# Patient Record
Sex: Female | Born: 1955
Health system: Southern US, Community
[De-identification: ages and names within clinical notes are randomized; demographics above are authoritative.]

## PROBLEM LIST (undated history)

## (undated) DIAGNOSIS — G44009 Cluster headache syndrome, unspecified, not intractable: Secondary | ICD-10-CM

## (undated) DIAGNOSIS — G8929 Other chronic pain: Secondary | ICD-10-CM

## (undated) DIAGNOSIS — R519 Headache, unspecified: Secondary | ICD-10-CM

## (undated) DIAGNOSIS — R51 Headache: Secondary | ICD-10-CM

## (undated) HISTORY — PX: TONSILLECTOMY: SUR1361

## (undated) HISTORY — PX: NOSE SURGERY: SHX723

## (undated) HISTORY — DX: Headache, unspecified: R51.9

## (undated) HISTORY — PX: APPENDECTOMY: SHX54

## (undated) HISTORY — DX: Headache: R51

## (undated) HISTORY — DX: Other chronic pain: G89.29

## (undated) HISTORY — PX: ABDOMINAL HYSTERECTOMY: SHX81

## (undated) HISTORY — PX: TOTAL ABDOMINAL HYSTERECTOMY: SHX209

---

## 2006-06-03 ENCOUNTER — Emergency Department: Payer: Self-pay | Admitting: Internal Medicine

## 2006-06-13 ENCOUNTER — Emergency Department: Payer: Self-pay | Admitting: Emergency Medicine

## 2006-11-10 ENCOUNTER — Ambulatory Visit: Payer: Self-pay | Admitting: Internal Medicine

## 2006-11-15 ENCOUNTER — Ambulatory Visit: Payer: Self-pay | Admitting: Internal Medicine

## 2006-12-12 ENCOUNTER — Emergency Department: Payer: Self-pay | Admitting: Emergency Medicine

## 2009-09-12 ENCOUNTER — Emergency Department: Payer: Self-pay | Admitting: Emergency Medicine

## 2010-07-08 ENCOUNTER — Ambulatory Visit: Payer: Self-pay | Admitting: Family Medicine

## 2013-02-09 ENCOUNTER — Emergency Department (HOSPITAL_COMMUNITY)
Admission: EM | Admit: 2013-02-09 | Discharge: 2013-02-10 | Disposition: A | Discharge status: Home / Self Care | Payer: No Typology Code available for payment source | Attending: Emergency Medicine | Admitting: Emergency Medicine

## 2013-02-09 ENCOUNTER — Encounter (HOSPITAL_COMMUNITY): Payer: Self-pay | Admitting: Emergency Medicine

## 2013-02-09 DIAGNOSIS — S42002A Fracture of unspecified part of left clavicle, initial encounter for closed fracture: Secondary | ICD-10-CM

## 2013-02-09 DIAGNOSIS — S42102A Fracture of unspecified part of scapula, left shoulder, initial encounter for closed fracture: Secondary | ICD-10-CM

## 2013-02-09 DIAGNOSIS — Y9389 Activity, other specified: Secondary | ICD-10-CM | POA: Insufficient documentation

## 2013-02-09 DIAGNOSIS — IMO0002 Reserved for concepts with insufficient information to code with codable children: Secondary | ICD-10-CM | POA: Insufficient documentation

## 2013-02-09 DIAGNOSIS — S41009A Unspecified open wound of unspecified shoulder, initial encounter: Secondary | ICD-10-CM | POA: Insufficient documentation

## 2013-02-09 DIAGNOSIS — S0990XA Unspecified injury of head, initial encounter: Secondary | ICD-10-CM | POA: Insufficient documentation

## 2013-02-09 DIAGNOSIS — Z8669 Personal history of other diseases of the nervous system and sense organs: Secondary | ICD-10-CM | POA: Insufficient documentation

## 2013-02-09 DIAGNOSIS — S2239XA Fracture of one rib, unspecified side, initial encounter for closed fracture: Secondary | ICD-10-CM | POA: Insufficient documentation

## 2013-02-09 DIAGNOSIS — Y92410 Unspecified street and highway as the place of occurrence of the external cause: Secondary | ICD-10-CM | POA: Insufficient documentation

## 2013-02-09 DIAGNOSIS — S2232XA Fracture of one rib, left side, initial encounter for closed fracture: Secondary | ICD-10-CM

## 2013-02-09 DIAGNOSIS — F17200 Nicotine dependence, unspecified, uncomplicated: Secondary | ICD-10-CM | POA: Insufficient documentation

## 2013-02-09 DIAGNOSIS — S298XXA Other specified injuries of thorax, initial encounter: Secondary | ICD-10-CM | POA: Insufficient documentation

## 2013-02-09 DIAGNOSIS — S42199A Fracture of other part of scapula, unspecified shoulder, initial encounter for closed fracture: Secondary | ICD-10-CM | POA: Insufficient documentation

## 2013-02-09 DIAGNOSIS — S42033A Displaced fracture of lateral end of unspecified clavicle, initial encounter for closed fracture: Secondary | ICD-10-CM | POA: Insufficient documentation

## 2013-02-09 HISTORY — DX: Cluster headache syndrome, unspecified, not intractable: G44.009
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
## 2013-02-10 ENCOUNTER — Emergency Department (HOSPITAL_COMMUNITY): Payer: No Typology Code available for payment source

## 2013-02-10 MED ORDER — ONDANSETRON HCL 4 MG/2ML IJ SOLN
4.0000 mg | Freq: Once | INTRAMUSCULAR | Status: AC
  Administered 2013-02-10: 4 mg via INTRAVENOUS
  Filled 2013-02-10: qty 2

## 2013-02-10 MED ORDER — IOHEXOL 300 MG/ML  SOLN
100.0000 mL | Freq: Once | INTRAMUSCULAR | Status: AC | PRN
  Administered 2013-02-10: 100 mL via INTRAVENOUS

## 2013-02-10 MED ORDER — MORPHINE SULFATE 4 MG/ML IJ SOLN
4.0000 mg | Freq: Once | INTRAMUSCULAR | Status: AC
  Administered 2013-02-10: 4 mg via INTRAVENOUS
  Filled 2013-02-10: qty 1

## 2013-02-10 MED ORDER — HYDROCODONE-ACETAMINOPHEN 5-325 MG PO TABS: 2.0000 | ORAL_TABLET | ORAL | Status: DC | PRN

## 2013-02-10 MED ORDER — KETOROLAC TROMETHAMINE 60 MG/2ML IM SOLN
60.0000 mg | Freq: Once | INTRAMUSCULAR | Status: AC
  Administered 2013-02-10: 60 mg via INTRAMUSCULAR
  Filled 2013-02-10: qty 2

## 2013-02-10 MED ORDER — MORPHINE SULFATE 2 MG/ML IJ SOLN
2.0000 mg | Freq: Once | INTRAMUSCULAR | Status: AC
  Administered 2013-02-10: 2 mg via INTRAVENOUS
  Filled 2013-02-10: qty 1
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
## 2013-02-10 NOTE — ED Provider Notes (Signed)
Medical screening examination/treatment/procedure(s) were conducted as a shared visit with non-physician practitioner(s) and myself.  I personally evaluated the patient during the encounter  Please see my separate respective documentation pertaining to this patient encounter   Vida Roller, MD 02/10/13 [phone]
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
## 2014-11-07 ENCOUNTER — Telehealth: Payer: Self-pay | Admitting: Unknown Physician Specialty

## 2014-11-07 NOTE — Telephone Encounter (Signed)
Called pt to reschedule 11/22/14 appt, no answer, left voicemail, mailed a letter to the pt informing pt the appt has been cancelled. Thanks.

## 2014-11-22 ENCOUNTER — Encounter: Payer: Self-pay | Admitting: Unknown Physician Specialty

## 2014-12-16 ENCOUNTER — Encounter: Payer: Self-pay | Admitting: Unknown Physician Specialty
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
## 2015-08-13 ENCOUNTER — Encounter: Payer: Self-pay | Admitting: Family Medicine

## 2015-08-14 ENCOUNTER — Ambulatory Visit (INDEPENDENT_AMBULATORY_CARE_PROVIDER_SITE_OTHER): Payer: Self-pay | Admitting: Family Medicine

## 2015-08-14 ENCOUNTER — Encounter: Payer: Self-pay | Admitting: Family Medicine

## 2015-08-14 VITALS — BP 112/74 | HR 69 | Temp 98.6°F | Ht 69.0 in | Wt 200.0 lb

## 2015-08-14 DIAGNOSIS — G44009 Cluster headache syndrome, unspecified, not intractable: Secondary | ICD-10-CM | POA: Insufficient documentation

## 2015-08-14 DIAGNOSIS — G44029 Chronic cluster headache, not intractable: Secondary | ICD-10-CM

## 2015-08-14 DIAGNOSIS — H8112 Benign paroxysmal vertigo, left ear: Secondary | ICD-10-CM

## 2015-08-14 MED ORDER — VERAPAMIL HCL 80 MG PO TABS: 80.0000 mg | ORAL_TABLET | Freq: Three times a day (TID) | ORAL | Status: DC

## 2015-08-14 NOTE — Assessment & Plan Note (Signed)
Under good control. Continue verapamil. Recheck 6 months. Call with any concerns.

## 2015-08-14 NOTE — Progress Notes (Signed)
BP 112/74 mmHg  Pulse 69  Temp(Src) 98.6 F (37 C)  Ht  (1.753 m)  Wt 200 lb (90.719 kg)  BMI 29.52 kg/m2  SpO2 98%   Subjective:    Patient ID: Rhonda Blankenship, female    DOB: 1956/02/02, 60 y.o.   MRN: [id number]  HPI: Rhonda Blankenship is a 60 y.o. female  Chief Complaint  Patient presents with  . Establish Care   Cluster Headaches Duration: Since Sep 28, 1996, hasn't had one since last year when she had to go to the ER Onset: sudden Severity: severe Quality: sharp Frequency: intermittent Location: in the bone of the L side of her face Headache duration: hours to days Radiation: yes into her ear Time of day headache occurs: depends  Alleviating factors: ibuprofen, coffee, oxygen Aggravating factors: heat and changes in sleeping pattern Headache status at time of visit: asymptomatic Treatments attempted: Treatments attempted: verapamil    Aura: no Nausea:  no Vomiting: no Photophobia:  yes Phonophobia:  yes Effect on social functioning:  yes Confusion:  no Gait disturbance/ataxia:  no Behavioral changes:  no Fevers:  no  DIZZINESS Duration: last week for about 2 hours Description of symptoms: room spinning Duration of episode: about 2 hours Dizziness frequency: no history of the same Provoking factors: none Aggravating factors:  none Triggered by rolling over in bed: no Triggered by bending over: no Aggravated by head movement: no Aggravated by exertion, coughing, loud noises: no Recent head injury: no Recent or current viral symptoms: yes History of vasovagal episodes: no Nausea: no Vomiting: no Tinnitus: no Hearing loss: no Aural fullness: no Headache: no Photophobia/phonophobia: no Unsteady gait: no Postural instability: no Diplopia, dysarthria, dysphagia or weakness: no Related to exertion: no Pallor: no Diaphoresis: no Dyspnea: no Chest pain: no   Active Ambulatory Problems    Diagnosis Date Noted  . Cluster headaches    Resolved  Ambulatory Problems    Diagnosis Date Noted  . No Resolved Ambulatory Problems   Past Medical History  Diagnosis Date  . Chronic headache    Past Surgical History  Procedure Laterality Date  . Appendectomy    . Abdominal hysterectomy    . Tonsillectomy    . Nose surgery     No Known Allergies  Family History  Problem Relation Age of Onset  . Heart disease Mother   . Arthritis Sister   . Heart disease Brother   . Migraines Son   . Cancer Sister     Tumor  . Muscular dystrophy Brother   . Alzheimer's disease Maternal Grandmother   . Hypertension Maternal Grandmother   . Stroke Maternal Grandfather   . Heart attack Maternal Grandfather   . Hypertension Paternal Grandfather    Social History   Social History  . Marital Status: Single    Spouse Name: N/A  . Number of Children: N/A  . Years of Education: N/A   Social History Main Topics  . Smoking status: Current Every Day Smoker -- 0.50 packs/day    Types: Cigarettes  . Smokeless tobacco: Never Used  . Alcohol Use: 0.0 oz/week    0 Standard drinks or equivalent per week     Comment: Wine on occasion  . Drug Use: No  . Sexual Activity: Not Asked   Other Topics Concern  . None   Social History Narrative   Review of Systems  Constitutional: Negative.   HENT: Negative for congestion, dental problem, drooling, ear discharge, ear pain, facial swelling, hearing loss,  mouth sores, nosebleeds, postnasal drip, rhinorrhea, sinus pressure, sneezing, sore throat, tinnitus, trouble swallowing and voice change.   Respiratory: Negative.   Cardiovascular: Negative.   Musculoskeletal: Positive for myalgias. Negative for back pain, joint swelling, arthralgias, gait problem, neck pain and neck stiffness.  Skin: Negative.   Neurological: Positive for dizziness (last week for about an hour or so, visual changes) and headaches. Negative for tremors, seizures, syncope, facial asymmetry, speech difficulty, weakness, light-headedness  and numbness.    Per HPI unless specifically indicated above     Objective:    BP 112/74 mmHg  Pulse 69  Temp(Src) 98.6 F (37 C)  Ht 5\' 9"  (1.753 m)  Wt 200 lb (90.719 kg)  BMI 29.52 kg/m2  SpO2 98%  Wt Readings from Last 3 Encounters:  08/14/15 200 lb (90.719 kg)    Physical Exam  Constitutional: She is oriented to person, place, and time. She appears well-developed and well-nourished. No distress.  HENT:  Head: Normocephalic and atraumatic.  Right Ear: Hearing and external ear normal.  Left Ear: Hearing and external ear normal.  Nose: Nose normal.  Mouth/Throat: Oropharynx is clear and moist. No oropharyngeal exudate.  Eyes: Conjunctivae and lids are normal. Pupils are equal, round, and reactive to light. Right eye exhibits no discharge. Left eye exhibits no discharge. No scleral icterus. Right eye exhibits normal extraocular motion and no nystagmus. Left eye exhibits nystagmus. Left eye exhibits normal extraocular motion.  Neck: Normal range of motion. Neck supple. No JVD present. No tracheal deviation present. No thyromegaly present.  Cardiovascular: Normal rate, regular rhythm, normal heart sounds and intact distal pulses.  Exam reveals no gallop and no friction rub.   No murmur heard. Pulmonary/Chest: Effort normal and breath sounds normal. No stridor. No respiratory distress. She has no wheezes. She has no rales. She exhibits no tenderness.  Musculoskeletal: Normal range of motion.  Lymphadenopathy:    She has no cervical adenopathy.  Neurological: She is alert and oriented to person, place, and time. She has normal reflexes. She displays normal reflexes. No cranial nerve deficit. She exhibits normal muscle tone. Coordination normal.  Skin: Skin is warm, dry and intact. No rash noted. She is not diaphoretic. No erythema.  Psychiatric: She has a normal mood and affect. Her speech is normal and behavior is normal. Judgment and thought content normal. Cognition and memory  are normal.  Nursing note and vitals reviewed.   No results found for this or any previous visit.    Assessment & Plan:   Problem List Items Addressed This Visit      Nervous and Auditory   Cluster headaches - Primary    Under good control. Continue verapamil. Recheck 6 months. Call with any concerns.       Relevant Medications   verapamil (CALAN) 80 MG tablet    Other Visit Diagnoses    BPPV (benign paroxysmal positional vertigo), left        Epley's manuver given today. Monitor closely. Call with any concerns. Call if not getting better or getting worse.         Follow up plan: Return in about 6 months (around 02/14/2016) for Physical.

## 2016-02-17 ENCOUNTER — Encounter: Payer: Self-pay | Admitting: Family Medicine

## 2016-02-17 ENCOUNTER — Ambulatory Visit (INDEPENDENT_AMBULATORY_CARE_PROVIDER_SITE_OTHER): Payer: Self-pay | Admitting: Family Medicine

## 2016-02-17 VITALS — BP 117/80 | HR 77 | Temp 98.4°F | Ht 68.5 in | Wt 202.2 lb

## 2016-02-17 DIAGNOSIS — F419 Anxiety disorder, unspecified: Secondary | ICD-10-CM | POA: Insufficient documentation

## 2016-02-17 DIAGNOSIS — Z0000 Encounter for general adult medical examination without abnormal findings: Secondary | ICD-10-CM

## 2016-02-17 DIAGNOSIS — G44029 Chronic cluster headache, not intractable: Secondary | ICD-10-CM

## 2016-02-17 DIAGNOSIS — Z79899 Other long term (current) drug therapy: Secondary | ICD-10-CM | POA: Insufficient documentation

## 2016-02-17 LAB — UA/M W/RFLX CULTURE, ROUTINE
BILIRUBIN UA: NEGATIVE
GLUCOSE, UA: NEGATIVE
KETONES UA: NEGATIVE
Leukocytes, UA: NEGATIVE
Nitrite, UA: NEGATIVE
PROTEIN UA: NEGATIVE
RBC UA: NEGATIVE
Specific Gravity, UA: 1.015 (ref 1.005–1.030)
Urobilinogen, Ur: 0.2 mg/dL (ref 0.2–1.0)
pH, UA: 6 (ref 5.0–7.5)

## 2016-02-17 MED ORDER — DIAZEPAM 2 MG PO TABS: 1.0000 mg | ORAL_TABLET | Freq: Every day | ORAL | 0 refills | Status: DC | PRN

## 2016-02-17 MED ORDER — VERAPAMIL HCL 80 MG PO TABS: 80.0000 mg | ORAL_TABLET | Freq: Three times a day (TID) | ORAL | 1 refills | Status: DC

## 2016-02-17 NOTE — Assessment & Plan Note (Signed)
For valium

## 2016-02-17 NOTE — Assessment & Plan Note (Signed)
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 She is feeling anxious and down over this. Will give her low dose valium. 30 pills should last 3-6 months. Controlled substance agreement signed today.

## 2016-02-17 NOTE — Assessment & Plan Note (Signed)
Stable. Refills given today. Call with concerns.

## 2016-02-17 NOTE — Progress Notes (Signed)
BP 117/80 (BP Location: Left Arm, Patient Position: Sitting, Cuff Size: Large)   Pulse 77   Temp 98.4 F (36.9 C)   Ht 5' 8.5" (1.74 m)   Wt 202 lb 3.2 oz (91.7 kg)   SpO2 97%   BMI 30.30 kg/m    Subjective:    Patient ID: Rhonda Blankenship, female    DOB: 1955-09-17, 60 y.o.   MRN: [id number]  HPI: Rhonda Blankenship is a 60 y.o. female presenting on 02/17/2016 for comprehensive medical examination. 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  Mood status: stable Satisfied with current treatment?: yes Symptom severity: mild  Depressed mood: yes Anxious mood: no Anhedonia: no Significant weight loss or gain: no Insomnia: no  Fatigue: yes Feelings of worthlessness or guilt: no Impaired concentration/indecisiveness: yes Suicidal ideations: no Hopelessness: no Crying spells: no Depression screen PHQ 2/9 02/17/2016  Decreased Interest 1  Down, Depressed, Hopeless 1  PHQ - 2 Score 2   She currently lives with: couple she cares for at times, otherwise alone Menopausal Symptoms: no  Past Medical History:  Past Medical History:  Diagnosis Date  . Chronic headache   . Cluster headaches     Surgical History:  Past Surgical History:  Procedure Laterality Date  . ABDOMINAL HYSTERECTOMY    . APPENDECTOMY    . NOSE SURGERY    . TONSILLECTOMY      Medications:  No current outpatient prescriptions on file prior to visit.   No current facility-administered medications on file prior to visit.     Allergies:  No Known Allergies  Social History:  Social History   Social History  . Marital status: Single    Spouse name: N/A  . Number of children: N/A  . Years of education: N/A   Occupational History  . Not on file.   Social History Main Topics  . Smoking status: Current Every Day Smoker    Packs/day: 0.50   Types: Cigarettes  . Smokeless tobacco: Never Used  . Alcohol use 0.0 oz/week     Comment: Wine on occasion  . Drug use: No  . Sexual activity: Not on file   Other Topics Concern  . Not on file   Social History Narrative  . No narrative on file   History  Smoking Status  . Current Every Day Smoker  . Packs/day: 0.50  . Types: Cigarettes  Smokeless Tobacco  . Never Used   History  Alcohol Use  . 0.0 oz/week    Comment: Wine on occasion    Family History:  Family History  Problem Relation Age of Onset  . Heart disease Mother   . Arthritis Sister   . Heart disease Brother   . Migraines Son   . Cancer Sister     Tumor  . Muscular dystrophy Brother   . Alzheimer's disease Maternal Grandmother   . Hypertension Maternal Grandmother   . Stroke Maternal Grandfather   . Heart attack Maternal Grandfather   . Hypertension Paternal Grandfather     Past medical history, surgical history, medications, allergies, family history and social history reviewed with patient today and changes made to appropriate areas of the chart.   Review of Systems  Constitutional: Negative.   HENT: Negative for congestion, ear discharge, ear pain, hearing loss, nosebleeds, sore throat and tinnitus.  Eyes: Negative.   Respiratory: Negative.  Negative for stridor.   Cardiovascular: Negative.   Gastrointestinal: Positive for constipation and heartburn. Negative for abdominal pain, blood in stool, diarrhea, melena, nausea and vomiting.  Genitourinary: Negative.   Musculoskeletal: Negative.   Skin: Negative.   Neurological: Positive for headaches. Negative for dizziness, tingling, tremors, sensory change, speech change, focal weakness, seizures and loss of consciousness.  Endo/Heme/Allergies: Negative.   Psychiatric/Behavioral: Positive for depression. Negative for hallucinations, memory loss, substance abuse and suicidal ideas. The patient is nervous/anxious. The patient does not have insomnia.      All other ROS negative except what is listed above and in the HPI.      Objective:    BP 117/80 (BP Location: Left Arm, Patient Position: Sitting, Cuff Size: Large)   Pulse 77   Temp 98.4 F (36.9 C)   Ht 5' 8.5" (1.74 m)   Wt 202 lb 3.2 oz (91.7 kg)   SpO2 97%   BMI 30.30 kg/m   Wt Readings from Last 3 Encounters:  02/17/16 202 lb 3.2 oz (91.7 kg)  08/14/15 200 lb (90.7 kg)    Physical Exam  Constitutional: She is oriented to person, place, and time. She appears well-developed and well-nourished. No distress.  HENT:  Head: Normocephalic and atraumatic.  Right Ear: Hearing, tympanic membrane, external ear and ear canal normal.  Left Ear: Hearing, tympanic membrane, external ear and ear canal normal.  Nose: Nose normal.  Mouth/Throat: Uvula is midline, oropharynx is clear and moist and mucous membranes are normal. No oropharyngeal exudate.  Eyes: Conjunctivae, EOM and lids are normal. Pupils are equal, round, and reactive to light. Right eye exhibits no discharge. Left eye exhibits no discharge. No scleral icterus.  Neck: Normal range of motion. Neck supple. No JVD present. No tracheal deviation present. No thyromegaly present.  Cardiovascular: Normal rate, regular rhythm, normal heart sounds and intact distal pulses.  Exam reveals no gallop and no friction rub.   No murmur heard. Pulmonary/Chest: Effort normal. No stridor. No respiratory distress. She has no wheezes. She has no rales. She exhibits no tenderness. Right breast exhibits no inverted nipple, no mass, no nipple discharge, no skin change and no tenderness. Left breast exhibits no inverted nipple, no mass, no nipple discharge, no skin change and no tenderness. Breasts are symmetrical.  Abdominal: Soft. Bowel sounds are normal. She exhibits no distension and no mass. There is no tenderness. There is no rebound and no guarding.  Genitourinary:  Genitourinary Comments: Deferred with shared decision making  Musculoskeletal:  Normal range of motion. She exhibits no edema, tenderness or deformity.  Lymphadenopathy:    She has no cervical adenopathy.  Neurological: She is alert and oriented to person, place, and time. She has normal reflexes. She displays normal reflexes. No cranial nerve deficit. She exhibits normal muscle tone. Coordination normal.  Skin: Skin is warm, dry and intact. No rash noted. She is not diaphoretic. No erythema. No pallor.  Psychiatric: She has a normal mood and affect. Her speech is normal and behavior is normal. Judgment and thought content normal. Cognition and memory are normal.  Nursing note and vitals reviewed.   No results found for this or any previous visit.    Assessment & Plan:   Problem List Items Addressed This Visit      Nervous and Auditory   Cluster headaches    Stable. Refills given today. Call with concerns.       Relevant Medications   verapamil (CALAN)  80 MG tablet     Other   Acute anxiety    Currently the gentleman she cares for is on hospice. She is feeling anxious and down over this. Will give her low dose valium. 30 pills should last 3-6 months. Controlled substance agreement signed today.      Relevant Medications   diazepam (VALIUM) 2 MG tablet   Controlled substance agreement signed    For valium       Other Visit Diagnoses    Routine general medical examination at a health care facility    -  Primary   Declines vaccines at this time. Screening labs checked today. Pap N/A. Declines colonoscopy at this time. Declines mammogram at this time.   Relevant Orders   CBC with Differential/Platelet   Comprehensive metabolic panel   Lipid Panel w/o Chol/HDL Ratio   TSH   UA/M w/rflx Culture, Routine       Follow up plan: Return in about 6 months (around 08/17/2016) for Follow up mood and headaches.   LABORATORY TESTING:  - Pap smear: not applicable  IMMUNIZATIONS:   - Tdap: Tetanus vaccination status reviewed: Declined today. - Influenza:  Refused - Pneumovax: Up to date  SCREENING: -Mammogram: Declined today  - Colonoscopy: Refused  - Bone Density: Not applicable   PATIENT COUNSELING:   Advised to take 1 mg of folate supplement per day if capable of pregnancy.   Sexuality: Discussed sexually transmitted diseases, partner selection, use of condoms, avoidance of unintended pregnancy  and contraceptive alternatives.   Advised to avoid cigarette smoking.  I discussed with the patient that most people either abstain from alcohol or drink within safe limits (<=14/week and <=4 drinks/occasion for males, <=7/weeks and <= 3 drinks/occasion for females) and that the risk for alcohol disorders and other health effects rises proportionally with the number of drinks per week and how often a drinker exceeds daily limits.  Discussed cessation/primary prevention of drug use and availability of treatment for abuse.   Diet: Encouraged to adjust caloric intake to maintain  or achieve ideal body weight, to reduce intake of dietary saturated fat and total fat, to limit sodium intake by avoiding high sodium foods and not adding table salt, and to maintain adequate dietary potassium and calcium preferably from fresh fruits, vegetables, and low-fat dairy products.    stressed the importance of regular exercise  Injury prevention: Discussed safety belts, safety helmets, smoke detector, smoking near bedding or upholstery.   Dental health: Discussed importance of regular tooth brushing, flossing, and dental visits.    NEXT PREVENTATIVE PHYSICAL DUE IN 1 YEAR. Return in about 6 months (around 08/17/2016) for Follow up mood and headaches.

## 2016-02-17 NOTE — Patient Instructions (Addendum)
Health Maintenance, Female Adopting a healthy lifestyle and getting preventive care can go a long way to promote health and wellness. Talk with your health care provider about what schedule of regular examinations is right for you. This is a good chance for you to check in with your provider about disease prevention and staying healthy. In between checkups, there are plenty of things you can do on your own. Experts have done a lot of research about which lifestyle changes and preventive measures are most likely to keep you healthy. Ask your health care provider for more information. WEIGHT AND DIET  Eat a healthy diet  Be sure to include plenty of vegetables, fruits, low-fat dairy products, and lean protein.  Do not eat a lot of foods high in solid fats, added sugars, or salt.  Get regular exercise. This is one of the most important things you can do for your health.  Most adults should exercise for at least 150 minutes each week. The exercise should increase your heart rate and make you sweat (moderate-intensity exercise).  Most adults should also do strengthening exercises at least twice a week. This is in addition to the moderate-intensity exercise.  Maintain a healthy weight  Body mass index (BMI) is a measurement that can be used to identify possible weight problems. It estimates body fat based on height and weight. Your health care provider can help determine your BMI and help you achieve or maintain a healthy weight.  For females 20 years of age and older:   A BMI below 18.5 is considered underweight.  A BMI of 18.5 to 24.9 is normal.  A BMI of 25 to 29.9 is considered overweight.  A BMI of 30 and above is considered obese.  Watch levels of cholesterol and blood lipids  You should start having your blood tested for lipids and cholesterol at 58 years of age, then have this test every 5 years.  You may need to have your cholesterol levels checked more often if:  Your lipid  or cholesterol levels are high.  You are older than 58 years of age.  You are at high risk for heart disease.  CANCER SCREENING   Lung Cancer  Lung cancer screening is recommended for adults 55-80 years old who are at high risk for lung cancer because of a history of smoking.  A yearly low-dose CT scan of the lungs is recommended for people who:  Currently smoke.  Have quit within the past 15 years.  Have at least a 30-pack-year history of smoking. A pack year is smoking an average of one pack of cigarettes a day for 1 year.  Yearly screening should continue until it has been 15 years since you quit.  Yearly screening should stop if you develop a health problem that would prevent you from having lung cancer treatment.  Breast Cancer  Practice breast self-awareness. This means understanding how your breasts normally appear and feel.  It also means doing regular breast self-exams. Let your health care provider know about any changes, no matter how small.  If you are in your 20s or 30s, you should have a clinical breast exam (CBE) by a health care provider every 1-3 years as part of a regular health exam.  If you are 40 or older, have a CBE every year. Also consider having a breast X-ray (mammogram) every year.  If you have a family history of breast cancer, talk to your health care provider about genetic screening.  If you   are at high risk for breast cancer, talk to your health care provider about having an MRI and a mammogram every year.  Breast cancer gene (BRCA) assessment is recommended for women who have family members with BRCA-related cancers. BRCA-related cancers include:  Breast.  Ovarian.  Tubal.  Peritoneal cancers.  Results of the assessment will determine the need for genetic counseling and BRCA1 and BRCA2 testing. Cervical Cancer Your health care provider may recommend that you be screened regularly for cancer of the pelvic organs (ovaries, uterus, and  vagina). This screening involves a pelvic examination, including checking for microscopic changes to the surface of your cervix (Pap test). You may be encouraged to have this screening done every 3 years, beginning at age 21.  For women ages 30-65, health care providers may recommend pelvic exams and Pap testing every 3 years, or they may recommend the Pap and pelvic exam, combined with testing for human papilloma virus (HPV), every 5 years. Some types of HPV increase your risk of cervical cancer. Testing for HPV may also be done on women of any age with unclear Pap test results.  Other health care providers may not recommend any screening for nonpregnant women who are considered low risk for pelvic cancer and who do not have symptoms. Ask your health care provider if a screening pelvic exam is right for you.  If you have had past treatment for cervical cancer or a condition that could lead to cancer, you need Pap tests and screening for cancer for at least 20 years after your treatment. If Pap tests have been discontinued, your risk factors (such as having a new sexual partner) need to be reassessed to determine if screening should resume. Some women have medical problems that increase the chance of getting cervical cancer. In these cases, your health care provider may recommend more frequent screening and Pap tests. Colorectal Cancer  This type of cancer can be detected and often prevented.  Routine colorectal cancer screening usually begins at 58 years of age and continues through 58 years of age.  Your health care provider may recommend screening at an earlier age if you have risk factors for colon cancer.  Your health care provider may also recommend using home test kits to check for hidden blood in the stool.  A small camera at the end of a tube can be used to examine your colon directly (sigmoidoscopy or colonoscopy). This is done to check for the earliest forms of colorectal  cancer.  Routine screening usually begins at age 50.  Direct examination of the colon should be repeated every 5-10 years through 58 years of age. However, you may need to be screened more often if early forms of precancerous polyps or small growths are found. Skin Cancer  Check your skin from head to toe regularly.  Tell your health care provider about any new moles or changes in moles, especially if there is a change in a mole's shape or color.  Also tell your health care provider if you have a mole that is larger than the size of a pencil eraser.  Always use sunscreen. Apply sunscreen liberally and repeatedly throughout the day.  Protect yourself by wearing long sleeves, pants, a wide-brimmed hat, and sunglasses whenever you are outside. HEART DISEASE, DIABETES, AND HIGH BLOOD PRESSURE   High blood pressure causes heart disease and increases the risk of stroke. High blood pressure is more likely to develop in:  People who have blood pressure in the high end   of the normal range (130-139/85-89 mm Hg).  People who are overweight or obese.  People who are African American.  If you are 38-23 years of age, have your blood pressure checked every 3-5 years. If you are 61 years of age or older, have your blood pressure checked every year. You should have your blood pressure measured twice--once when you are at a hospital or clinic, and once when you are not at a hospital or clinic. Record the average of the two measurements. To check your blood pressure when you are not at a hospital or clinic, you can use:  An automated blood pressure machine at a pharmacy.  A home blood pressure monitor.  If you are between 45 years and 39 years old, ask your health care provider if you should take aspirin to prevent strokes.  Have regular diabetes screenings. This involves taking a blood sample to check your fasting blood sugar level.  If you are at a normal weight and have a low risk for diabetes,  have this test once every three years after 58 years of age.  If you are overweight and have a high risk for diabetes, consider being tested at a younger age or more often. PREVENTING INFECTION  Hepatitis B  If you have a higher risk for hepatitis B, you should be screened for this virus. You are considered at high risk for hepatitis B if:  You were born in a country where hepatitis B is common. Ask your health care provider which countries are considered high risk.  Your parents were born in a high-risk country, and you have not been immunized against hepatitis B (hepatitis B vaccine).  You have HIV or AIDS.  You use needles to inject street drugs.  You live with someone who has hepatitis B.  You have had sex with someone who has hepatitis B.  You get hemodialysis treatment.  You take certain medicines for conditions, including cancer, organ transplantation, and autoimmune conditions. Hepatitis C  Blood testing is recommended for:  Everyone born from [birth year] through 1963.  Anyone with known risk factors for hepatitis C. Sexually transmitted infections (STIs)  You should be screened for sexually transmitted infections (STIs) including gonorrhea and chlamydia if:  You are sexually active and are younger than 58 years of age.  You are older than 58 years of age and your health care provider tells you that you are at risk for this type of infection.  Your sexual activity has changed since you were last screened and you are at an increased risk for chlamydia or gonorrhea. Ask your health care provider if you are at risk.  If you do not have HIV, but are at risk, it may be recommended that you take a prescription medicine daily to prevent HIV infection. This is called pre-exposure prophylaxis (PrEP). You are considered at risk if:  You are sexually active and do not regularly use condoms or know the HIV status of your partner(s).  You take drugs by injection.  You are sexually  active with a partner who has HIV. Talk with your health care provider about whether you are at high risk of being infected with HIV. If you choose to begin PrEP, you should first be tested for HIV. You should then be tested every 3 months for as long as you are taking PrEP.  PREGNANCY   If you are premenopausal and you may become pregnant, ask your health care provider about preconception counseling.  If you may  PrEP). You are considered at risk if:    You are sexually active and do not regularly use condoms or know the HIV status of your partner(s).    You take drugs by injection.    You are sexually  active with a partner who has HIV.  Talk with your health care provider about whether you are at high risk of being infected with HIV. If you choose to begin PrEP, you should first be tested for HIV. You should then be tested every 3 months for as long as you are taking PrEP.   PREGNANCY   · If you are premenopausal and you may become pregnant, ask your health care provider about preconception counseling.  · If you may become pregnant, take 400 to 800 micrograms (mcg) of folic acid every day.  · If you want to prevent pregnancy, talk to your health care provider about birth control (contraception).  OSTEOPOROSIS AND MENOPAUSE   · Osteoporosis is a disease in which the bones lose minerals and strength with aging. This can result in serious bone fractures. Your risk for osteoporosis can be identified using a bone density scan.  · If you are 65 years of age or older, or if you are at risk for osteoporosis and fractures, ask your health care provider if you should be screened.  · Ask your health care provider whether you should take a calcium or vitamin D supplement to lower your risk for osteoporosis.  · Menopause may have certain physical symptoms and risks.  · Hormone replacement therapy may reduce some of these symptoms and risks.  Talk to your health care provider about whether hormone replacement therapy is right for you.   HOME CARE INSTRUCTIONS   · Schedule regular health, dental, and eye exams.  · Stay current with your immunizations.    · Do not use any tobacco products including cigarettes, chewing tobacco, or electronic cigarettes.  · If you are pregnant, do not drink alcohol.  · If you are breastfeeding, limit how much and how often you drink alcohol.  · Limit alcohol intake to no more than 1 drink per day for nonpregnant women. One drink equals 12 ounces of beer, 5 ounces of wine, or 1½ ounces of hard liquor.  · Do not use street drugs.  · Do not share needles.  · Ask your health care provider for help if  you need support or information about quitting drugs.  · Tell your health care provider if you often feel depressed.  · Tell your health care provider if you have ever been abused or do not feel safe at home.     This information is not intended to replace advice given to you by your health care provider. Make sure you discuss any questions you have with your health care provider.     Document Released: 11/16/2010 Document Revised: 05/24/2014 Document Reviewed: 04/04/2013  Elsevier Interactive Patient Education ©2016 [employer]  Menopause is a normal process in which your reproductive ability comes to an end. This process happens gradually over a span of months to years, usually between the ages of 48 and 55. Menopause is complete when you have missed 12 consecutive menstrual periods.  It is important to talk with your health care provider about some of the most common conditions that affect postmenopausal women, such as heart disease, cancer, and bone loss (osteoporosis). Adopting a healthy lifestyle and getting preventive care can help to promote your health and wellness. Those actions can also   lower your chances of developing some of these common conditions.  WHAT SHOULD I KNOW ABOUT MENOPAUSE?  During menopause, you may experience a number of symptoms, such as:  · Moderate-to-severe hot flashes.  · Night sweats.  · Decrease in sex drive.  · Mood swings.  · Headaches.  · Tiredness.  · Irritability.  · Memory problems.  · Insomnia.  Choosing to treat or not to treat menopausal changes is an individual decision that you make with your health care provider.  WHAT SHOULD I KNOW ABOUT HORMONE REPLACEMENT THERAPY AND SUPPLEMENTS?  Hormone therapy products are effective for treating symptoms that are associated with menopause, such as hot flashes and night sweats. Hormone replacement carries certain risks, especially as you become older. If you are thinking about using estrogen or estrogen with progestin treatments,  discuss the benefits and risks with your health care provider.  WHAT SHOULD I KNOW ABOUT HEART DISEASE AND STROKE?  Heart disease, heart attack, and stroke become more likely as you age. This may be due, in part, to the hormonal changes that your body experiences during menopause. These can affect how your body processes dietary fats, triglycerides, and cholesterol. Heart attack and stroke are both medical emergencies.  There are many things that you can do to help prevent heart disease and stroke:  · Have your blood pressure checked at least every 1-2 years. High blood pressure causes heart disease and increases the risk of stroke.  · If you are 55-79 years old, ask your health care provider if you should take aspirin to prevent a heart attack or a stroke.  · Do not use any tobacco products, including cigarettes, chewing tobacco, or electronic cigarettes. If you need help quitting, ask your health care provider.  · It is important to eat a healthy diet and maintain a healthy weight.    Be sure to include plenty of vegetables, fruits, low-fat dairy products, and lean protein.    Avoid eating foods that are high in solid fats, added sugars, or salt (sodium).  · Get regular exercise. This is one of the most important things that you can do for your health.    Try to exercise for at least 150 minutes each week. The type of exercise that you do should increase your heart rate and make you sweat. This is known as moderate-intensity exercise.    Try to do strengthening exercises at least twice each week. Do these in addition to the moderate-intensity exercise.  · Know your numbers. Ask your health care provider to check your cholesterol and your blood glucose. Continue to have your blood tested as directed by your health care provider.  WHAT SHOULD I KNOW ABOUT CANCER SCREENING?  There are several types of cancer. Take the following steps to reduce your risk and to catch any cancer development as early as  possible.  Breast Cancer  · Practice breast self-awareness.    This means understanding how your breasts normally appear and feel.    It also means doing regular breast self-exams. Let your health care provider know about any changes, no matter how small.  · If you are 40 or older, have a clinician do a breast exam (clinical breast exam or CBE) every year. Depending on your age, family history, and medical history, it may be recommended that you also have a yearly breast X-ray (mammogram).  · If you have a family history of breast cancer, talk with your health care provider about genetic screening.  ·   If you are at high risk for breast cancer, talk with your health care provider about having an MRI and a mammogram every year.  · Breast cancer (BRCA) gene test is recommended for women who have family members with BRCA-related cancers. Results of the assessment will determine the need for genetic counseling and BRCA1 and for BRCA2 testing. BRCA-related cancers include these types:    Breast. This occurs in males or females.    Ovarian.    Tubal. This may also be called fallopian tube cancer.    Cancer of the abdominal or pelvic lining (peritoneal cancer).    Prostate.    Pancreatic.  Cervical, Uterine, and Ovarian Cancer  Your health care provider may recommend that you be screened regularly for cancer of the pelvic organs. These include your ovaries, uterus, and vagina. This screening involves a pelvic exam, which includes checking for microscopic changes to the surface of your cervix (Pap test).  · For women ages 21-65, health care providers may recommend a pelvic exam and a Pap test every three years. For women ages 30-65, they may recommend the Pap test and pelvic exam, combined with testing for human papilloma virus (HPV), every five years. Some types of HPV increase your risk of cervical cancer. Testing for HPV may also be done on women of any age who have unclear Pap test results.  · Other health care providers  may not recommend any screening for nonpregnant women who are considered low risk for pelvic cancer and have no symptoms. Ask your health care provider if a screening pelvic exam is right for you.  · If you have had past treatment for cervical cancer or a condition that could lead to cancer, you need Pap tests and screening for cancer for at least 20 years after your treatment. If Pap tests have been discontinued for you, your risk factors (such as having a new sexual partner) need to be reassessed to determine if you should start having screenings again. Some women have medical problems that increase the chance of getting cervical cancer. In these cases, your health care provider may recommend that you have screening and Pap tests more often.  · If you have a family history of uterine cancer or ovarian cancer, talk with your health care provider about genetic screening.  · If you have vaginal bleeding after reaching menopause, tell your health care provider.  · There are currently no reliable tests available to screen for ovarian cancer.  Lung Cancer  Lung cancer screening is recommended for adults 55-80 years old who are at high risk for lung cancer because of a history of smoking. A yearly low-dose CT scan of the lungs is recommended if you:  · Currently smoke.  · Have a history of at least 30 pack-years of smoking and you currently smoke or have quit within the past 15 years. A pack-year is smoking an average of one pack of cigarettes per day for one year.  Yearly screening should:  · Continue until it has been 15 years since you quit.  · Stop if you develop a health problem that would prevent you from having lung cancer treatment.  Colorectal Cancer  · This type of cancer can be detected and can often be prevented.  · Routine colorectal cancer screening usually begins at age 50 and continues through age 75.  · If you have risk factors for colon cancer, your health care provider may recommend that you be  screened at an earlier age.  ·   wearing long sleeves, pants, a wide-brimmed hat, and sunglasses. WHAT SHOULD I KNOW ABOUT OSTEOPOROSIS? Osteoporosis is a condition in which bone destruction happens more quickly than new bone creation. After menopause, you may be at an increased risk for osteoporosis. To help prevent osteoporosis or the bone fractures that can happen because of osteoporosis, the following is recommended:  If you are 58-32 years old, get at least 1,000 mg of calcium and at least 600 mg of vitamin D per day.  If you are older than age 42 but younger than age 16, get at least 1,200 mg of calcium and at least 600 mg of vitamin D per day.  If you are older than  age 70, get at least 1,200 mg of calcium and at least 800 mg of vitamin D per day. Smoking and excessive alcohol intake increase the risk of osteoporosis. Eat foods that are rich in calcium and vitamin D, and do weight-bearing exercises several times each week as directed by your health care provider. WHAT SHOULD I KNOW ABOUT HOW MENOPAUSE AFFECTS [place]? Depression may occur at any age, but it is more common as you become older. Common symptoms of depression include:  Low or sad mood.  Changes in sleep patterns.  Changes in appetite or eating patterns.  Feeling an overall lack of motivation or enjoyment of activities that you previously enjoyed.  Frequent crying spells. Talk with your health care provider if you think that you are experiencing depression. WHAT SHOULD I KNOW ABOUT IMMUNIZATIONS? It is important that you get and maintain your immunizations. These include:  Tetanus, diphtheria, and pertussis (Tdap) booster vaccine.  Influenza every year before the flu season begins.  Pneumonia vaccine.  Shingles vaccine. Your health care provider may also recommend other immunizations.   This information is not intended to replace advice given to you by your health care provider. Make sure you discuss any questions you have with your health care provider.   Document Released: 06/25/2005 Document Revised: 05/24/2014 Document Reviewed: 01/03/2014 Elsevier Interactive Patient Education 2016 [employer]. Smoking Cessation, Tips for Success If you are ready to quit smoking, congratulations! You have chosen to help yourself be healthier. Cigarettes bring nicotine, tar, carbon monoxide, and other irritants into your body. Your lungs, heart, and blood vessels will be able to work better without these poisons. There are many different ways to quit smoking. Nicotine gum, nicotine patches, a nicotine inhaler, or nicotine nasal spray can help with physical craving. Hypnosis, support  groups, and medicines help break the habit of smoking. WHAT THINGS CAN I DO TO MAKE QUITTING EASIER?  Here are some tips to help you quit for good:  Pick a date when you will quit smoking completely. Tell all of your friends and family about your plan to quit on that date.  Do not try to slowly cut down on the number of cigarettes you are smoking. Pick a quit date and quit smoking completely starting on that day.  Throw away all cigarettes.   Clean and remove all ashtrays from your home, work, and car.  On a card, write down your reasons for quitting. Carry the card with you and read it when you get the urge to smoke.  Cleanse your body of nicotine. Drink enough water and fluids to keep your urine clear or pale yellow. Do this after quitting to flush the nicotine from your body.  Learn to predict your moods. Do not let a bad situation be your excuse to  have a cigarette. Some situations in your life might tempt you into wanting a cigarette.  Never have "just one" cigarette. It leads to wanting another and another. Remind yourself of your decision to quit.  Change habits associated with smoking. If you smoked while driving or when feeling stressed, try other activities to replace smoking. Stand up when drinking your coffee. Brush your teeth after eating. Sit in a different chair when you read the paper. Avoid alcohol while trying to quit, and try to drink fewer caffeinated beverages. Alcohol and caffeine may urge you to smoke.  Avoid foods and drinks that can trigger a desire to smoke, such as sugary or spicy foods and alcohol.  Ask people who smoke not to smoke around you.  Have something planned to do right after eating or having a cup of coffee. For example, plan to take a walk or exercise.  Try a relaxation exercise to calm you down and decrease your stress. Remember, you may be tense and nervous for the first 2 weeks after you quit, but this will pass.  Find new activities to keep  your hands busy. Play with a pen, coin, or rubber band. Doodle or draw things on paper.  Brush your teeth right after eating. This will help cut down on the craving for the taste of tobacco after meals. You can also try mouthwash.   Use oral substitutes in place of cigarettes. Try using lemon drops, carrots, cinnamon sticks, or chewing gum. Keep them handy so they are available when you have the urge to smoke.  When you have the urge to smoke, try deep breathing.  Designate your home as a nonsmoking area.  If you are a heavy smoker, ask your health care provider about a prescription for nicotine chewing gum. It can ease your withdrawal from nicotine.  Reward yourself. Set aside the cigarette money you save and buy yourself something nice.  Look for support from others. Join a support group or smoking cessation program. Ask someone at home or at work to help you with your plan to quit smoking.  Always ask yourself, "Do I need this cigarette or is this just a reflex?" Tell yourself, "Today, I choose not to smoke," or "I do not want to smoke." You are reminding yourself of your decision to quit.  Do not replace cigarette smoking with electronic cigarettes (commonly called e-cigarettes). The safety of e-cigarettes is unknown, and some may contain harmful chemicals.  If you relapse, do not give up! Plan ahead and think about what you will do the next time you get the urge to smoke. HOW WILL I FEEL WHEN I QUIT SMOKING? You may have symptoms of withdrawal because your body is used to nicotine (the addictive substance in cigarettes). You may crave cigarettes, be irritable, feel very hungry, cough often, get headaches, or have difficulty concentrating. The withdrawal symptoms are only temporary. They are strongest when you first quit but will go away within 10-14 days. When withdrawal symptoms occur, stay in control. Think about your reasons for quitting. Remind yourself that these are signs that your  body is healing and getting used to being without cigarettes. Remember that withdrawal symptoms are easier to treat than the major diseases that smoking can cause.  Even after the withdrawal is over, expect periodic urges to smoke. However, these cravings are generally short lived and will go away whether you smoke or not. Do not smoke! WHAT RESOURCES ARE AVAILABLE TO HELP ME QUIT SMOKING? Your health  care provider can direct you to community resources or hospitals for support, which may include:  Group support.  Education.  Hypnosis.  Therapy.   This information is not intended to replace advice given to you by your health care provider. Make sure you discuss any questions you have with your health care provider.   Document Released: 01/30/2004 Document Revised: 05/24/2014 Document Reviewed: 10/19/2012 Elsevier Interactive Patient Education [employer].

## 2016-02-18 ENCOUNTER — Encounter: Payer: Self-pay | Admitting: Family Medicine

## 2016-02-18 LAB — COMPREHENSIVE METABOLIC PANEL
ALK PHOS: 84 IU/L (ref 39–117)
ALT: 8 IU/L (ref 0–32)
AST: 10 IU/L (ref 0–40)
Albumin/Globulin Ratio: 1.8 (ref 1.2–2.2)
Albumin: 4.2 g/dL (ref 3.5–5.5)
BUN/Creatinine Ratio: 18 (ref 9–23)
BUN: 12 mg/dL (ref 6–24)
Bilirubin Total: <0.2 mg/dL (ref 0.0–1.2)
CO2: 26 mmol/L (ref 18–29)
Calcium: 9.5 mg/dL (ref 8.7–10.2)
Chloride: 99 mmol/L (ref 96–106)
Creatinine, Ser: 0.67 mg/dL (ref 0.57–1.00)
GFR calc Af Amer: 112 mL/min/{1.73_m2} (ref >59)
GFR calc non Af Amer: 97 mL/min/{1.73_m2} (ref >59)
GLOBULIN, TOTAL: 2.4 g/dL (ref 1.5–4.5)
Glucose: 92 mg/dL (ref 65–99)
POTASSIUM: 4 mmol/L (ref 3.5–5.2)
SODIUM: 141 mmol/L (ref 134–144)
Total Protein: 6.6 g/dL (ref 6.0–8.5)

## 2016-02-18 LAB — CBC WITH DIFFERENTIAL/PLATELET
BASOS ABS: 0 10*3/uL (ref 0.0–0.2)
Basos: 1 %
EOS (ABSOLUTE): 0.1 10*3/uL (ref 0.0–0.4)
EOS: 2 %
HEMATOCRIT: 38.6 % (ref 34.0–46.6)
Hemoglobin: 12.8 g/dL (ref 11.1–15.9)
IMMATURE GRANULOCYTES: 0 %
Immature Grans (Abs): 0 10*3/uL (ref 0.0–0.1)
LYMPHS ABS: 2.2 10*3/uL (ref 0.7–3.1)
Lymphs: 31 %
MCH: 28.3 pg (ref 26.6–33.0)
MCHC: 33.2 g/dL (ref 31.5–35.7)
MCV: 85 fL (ref 79–97)
MONOS ABS: 0.5 10*3/uL (ref 0.1–0.9)
Monocytes: 7 %
NEUTROS PCT: 59 %
Neutrophils Absolute: 4.3 10*3/uL (ref 1.4–7.0)
Platelets: 274 10*3/uL (ref 150–379)
RBC: 4.52 x10E6/uL (ref 3.77–5.28)
RDW: 13.7 % (ref 12.3–15.4)
WBC: 7.2 10*3/uL (ref 3.4–10.8)

## 2016-02-18 LAB — LIPID PANEL W/O CHOL/HDL RATIO
Cholesterol, Total: 211 mg/dL — ABNORMAL HIGH (ref 100–199)
HDL: 54 mg/dL (ref >39)
LDL Calculated: 127 mg/dL — ABNORMAL HIGH (ref 0–99)
Triglycerides: 149 mg/dL (ref 0–149)
VLDL Cholesterol Cal: 30 mg/dL (ref 5–40)

## 2016-02-18 LAB — TSH: TSH: 0.96 u[IU]/mL (ref 0.450–4.500)

## 2016-08-17 ENCOUNTER — Encounter: Payer: Self-pay | Admitting: Family Medicine

## 2016-08-17 ENCOUNTER — Ambulatory Visit (INDEPENDENT_AMBULATORY_CARE_PROVIDER_SITE_OTHER): Payer: Self-pay | Admitting: Family Medicine

## 2016-08-17 VITALS — BP 117/77 | HR 62 | Temp 98.6°F | Resp 17 | Ht 68.5 in | Wt 205.0 lb

## 2016-08-17 DIAGNOSIS — B379 Candidiasis, unspecified: Secondary | ICD-10-CM

## 2016-08-17 DIAGNOSIS — G44029 Chronic cluster headache, not intractable: Secondary | ICD-10-CM

## 2016-08-17 DIAGNOSIS — F419 Anxiety disorder, unspecified: Secondary | ICD-10-CM

## 2016-08-17 DIAGNOSIS — R062 Wheezing: Secondary | ICD-10-CM

## 2016-08-17 MED ORDER — FIRST-DUKES MOUTHWASH MT SUSP: 5.0000 mL | Freq: Four times a day (QID) | OROMUCOSAL | 1 refills | Status: DC

## 2016-08-17 MED ORDER — VERAPAMIL HCL 80 MG PO TABS: 80.0000 mg | ORAL_TABLET | Freq: Three times a day (TID) | ORAL | 3 refills | Status: DC

## 2016-08-17 MED ORDER — ALBUTEROL SULFATE (2.5 MG/3ML) 0.083% IN NEBU: 2.5000 mg | INHALATION_SOLUTION | Freq: Four times a day (QID) | RESPIRATORY_TRACT | 1 refills | Status: DC | PRN

## 2016-08-17 NOTE — Assessment & Plan Note (Signed)
Resolved. Call with any concerns.  

## 2016-08-17 NOTE — Progress Notes (Signed)
BP 117/77 (BP Location: Left Arm, Patient Position: Sitting, Cuff Size: Normal)   Pulse 62   Temp 98.6 F (37 C) (Oral)   Resp 17   Ht 5' 8.5" (1.74 m)   Wt 205 lb (93 kg)   SpO2 97%   BMI 30.72 kg/m    Subjective:    Patient ID: Rhonda Blankenship, female    DOB: May 16, 1956, 61 y.o.   MRN: [id number]  HPI: Rhonda Blankenship is a 61 y.o. female  Chief Complaint  Patient presents with  . Headache  . Anxiety  . Medication Refill    Verapamil   Cluster Headaches Duration: Since Sep 28, 1996, hasn't had one since last year when she had to go to the ER Onset: sudden Severity: severe Quality: sharp Frequency: intermittent Location: in the bone of the L side of her face Headache duration: hours to days Radiation: yes into her ear Time of day headache occurs: depends  Alleviating factors: ibuprofen, coffee, oxygen Aggravating factors: heat and changes in sleeping pattern Headache status at time of visit: asymptomatic Treatments attempted: Treatments attempted: verapamil    Aura: no Nausea:  no Vomiting: no Photophobia:  yes Phonophobia:  yes Effect on social functioning:  yes Confusion:  no Gait disturbance/ataxia:  no Behavioral changes:  no Fevers:  no  UPPER RESPIRATORY TRACT INFECTION Duration: About a week and a half Worst symptom: cough and wheezing Fever: no Cough: yes Shortness of breath: yes Wheezing: yes Chest pain: no Chest tightness: yes Chest congestion: no Nasal congestion: no Runny nose: yes Post nasal drip: yes Sneezing: no Sore throat: no Swollen glands: no Sinus pressure: no Headache: yes Face pain: no Toothache: no Ear pain: no  Ear pressure: no  Eyes red/itching:no Eye drainage/crusting: no  Vomiting: yes Rash: no Fatigue: yes Sick contacts: yes Strep contacts: no  Context: better Recurrent sinusitis: no Relief with OTC cold/cough medications: yes  Treatments attempted: tylenol and mucinex   Mood doing better. No problems now, not  taking the valium any more.   Tongue is feeling prickly and irritated  Relevant past medical, surgical, family and social history reviewed and updated as indicated. Interim medical history since our last visit reviewed. Allergies and medications reviewed and updated.  Review of Systems  Constitutional: Positive for fatigue. Negative for activity change, appetite change, chills, diaphoresis, fever and unexpected weight change.  HENT: Positive for congestion, postnasal drip and sore throat. Negative for dental problem, drooling, ear pain, facial swelling, hearing loss, mouth sores, nosebleeds, rhinorrhea, sinus pain, sinus pressure, sneezing, tinnitus, trouble swallowing and voice change.   Respiratory: Positive for cough and wheezing. Negative for apnea, choking, chest tightness, shortness of breath and stridor.   Cardiovascular: Negative.   Gastrointestinal: Positive for diarrhea, nausea and vomiting. Negative for abdominal distention, abdominal pain, anal bleeding, blood in stool, constipation and rectal pain.  Psychiatric/Behavioral: Negative.     Per HPI unless specifically indicated above     Objective:    BP 117/77 (BP Location: Left Arm, Patient Position: Sitting, Cuff Size: Normal)   Pulse 62   Temp 98.6 F (37 C) (Oral)   Resp 17   Ht 5' 8.5" (1.74 m)   Wt 205 lb (93 kg)   SpO2 97%   BMI 30.72 kg/m   Wt Readings from Last 3 Encounters:  08/17/16 205 lb (93 kg)  02/17/16 202 lb 3.2 oz (91.7 kg)  08/14/15 200 lb (90.7 kg)    Physical Exam  Constitutional: She is oriented to  person, place, and time. She appears well-developed and well-nourished. No distress.  HENT:  Head: Normocephalic and atraumatic.  Right Ear: Hearing and external ear normal.  Left Ear: Hearing and external ear normal.  Nose: Nose normal.  Mouth/Throat: Oropharynx is clear and moist. No oropharyngeal exudate.  Eyes: Conjunctivae, EOM and lids are normal. Pupils are equal, round, and reactive to  light. Right eye exhibits no discharge. Left eye exhibits no discharge. No scleral icterus.  Neck: Normal range of motion. Neck supple. No JVD present. No tracheal deviation present. No thyromegaly present.  Cardiovascular: Normal rate, regular rhythm, normal heart sounds and intact distal pulses.  Exam reveals no gallop and no friction rub.   No murmur heard. Pulmonary/Chest: Effort normal. No stridor. No respiratory distress. She has wheezes. She has no rales. She exhibits no tenderness.  Musculoskeletal: Normal range of motion.  Lymphadenopathy:    She has no cervical adenopathy.  Neurological: She is alert and oriented to person, place, and time.  Skin: Skin is warm, dry and intact. No rash noted. She is not diaphoretic. No erythema. No pallor.  Cracked tongue with white patches  Psychiatric: She has a normal mood and affect. Her speech is normal and behavior is normal. Judgment and thought content normal. Cognition and memory are normal.  Nursing note and vitals reviewed.   Results for orders placed or performed in visit on 02/17/16  CBC with Differential/Platelet  Result Value Ref Range   WBC 7.2 3.4 - 10.8 x10E3/uL   RBC 4.52 3.77 - 5.28 x10E6/uL   Hemoglobin 12.8 11.1 - 15.9 g/dL   Hematocrit 16.1 09.6 - 46.6 %   MCV 85 79 - 97 fL   MCH 28.3 26.6 - 33.0 pg   MCHC 33.2 31.5 - 35.7 g/dL   RDW 04.5 40.9 - 81.1 %   Platelets 274 150 - 379 x10E3/uL   Neutrophils 59 Not Estab. %   Lymphs 31 Not Estab. %   Monocytes 7 Not Estab. %   Eos 2 Not Estab. %   Basos 1 Not Estab. %   Neutrophils Absolute 4.3 1.4 - 7.0 x10E3/uL   Lymphocytes Absolute 2.2 0.7 - 3.1 x10E3/uL   Monocytes Absolute 0.5 0.1 - 0.9 x10E3/uL   EOS (ABSOLUTE) 0.1 0.0 - 0.4 x10E3/uL   Basophils Absolute 0.0 0.0 - 0.2 x10E3/uL   Immature Granulocytes 0 Not Estab. %   Immature Grans (Abs) 0.0 0.0 - 0.1 x10E3/uL  Comprehensive metabolic panel  Result Value Ref Range   Glucose 92 65 - 99 mg/dL   BUN 12 6 - 24  mg/dL   Creatinine, Ser 9.14 0.57 - 1.00 mg/dL   GFR calc non Af Amer 97 >59 mL/min/1.73   GFR calc Af Amer 112 >59 mL/min/1.73   BUN/Creatinine Ratio 18 9 - 23   Sodium 141 134 - 144 mmol/L   Potassium 4.0 3.5 - 5.2 mmol/L   Chloride 99 96 - 106 mmol/L   CO2 26 18 - 29 mmol/L   Calcium 9.5 8.7 - 10.2 mg/dL   Total Protein 6.6 6.0 - 8.5 g/dL   Albumin 4.2 3.5 - 5.5 g/dL   Globulin, Total 2.4 1.5 - 4.5 g/dL   Albumin/Globulin Ratio 1.8 1.2 - 2.2   Bilirubin Total <0.2 0.0 - 1.2 mg/dL   Alkaline Phosphatase 84 39 - 117 IU/L   AST 10 0 - 40 IU/L   ALT 8 0 - 32 IU/L  Lipid Panel w/o Chol/HDL Ratio  Result Value Ref Range  Cholesterol, Total 211 (H) 100 - 199 mg/dL   Triglycerides 295 0 - 149 mg/dL   HDL 54 >62 mg/dL   VLDL Cholesterol Cal 30 5 - 40 mg/dL   LDL Calculated 130 (H) 0 - 99 mg/dL  TSH  Result Value Ref Range   TSH 0.960 0.450 - 4.500 uIU/mL  UA/M w/rflx Culture, Routine  Result Value Ref Range   Specific Gravity, UA 1.015 1.005 - 1.030   pH, UA 6.0 5.0 - 7.5   Color, UA Yellow Yellow   Appearance Ur Clear Clear   Leukocytes, UA Negative Negative   Protein, UA Negative Negative/Trace   Glucose, UA Negative Negative   Ketones, UA Negative Negative   RBC, UA Negative Negative   Bilirubin, UA Negative Negative   Urobilinogen, Ur 0.2 0.2 - 1.0 mg/dL   Nitrite, UA Negative Negative      Assessment & Plan:   Problem List Items Addressed This Visit      Nervous and Auditory   Cluster headaches - Primary    Stable on current regimen. Continue current regimen. Refill given today. Call with any concerns.       Relevant Medications   verapamil (CALAN) 80 MG tablet     Other   Acute anxiety    Resolved. Call with any concerns.        Other Visit Diagnoses    Candida infection       Will treat with [employer] Magic mouth Wash. Call with any concerns or if not getting better.    Relevant Medications   Diphenhyd-Hydrocort-Nystatin (FIRST-DUKES MOUTHWASH) SUSP    Wheezing       Likely left over from URI. Will start nebulizer treatments. Call if not getting better or getting worse.        Follow up plan: Return in about 1 year (around 08/17/2017) for Physical.

## 2016-08-17 NOTE — Assessment & Plan Note (Signed)
Stable on current regimen. Continue current regimen. Refill given today. Call with any concerns.

## 2017-03-11 ENCOUNTER — Ambulatory Visit (INDEPENDENT_AMBULATORY_CARE_PROVIDER_SITE_OTHER): Payer: Self-pay | Admitting: Family Medicine

## 2017-03-11 ENCOUNTER — Encounter: Payer: Self-pay | Admitting: Family Medicine

## 2017-03-11 VITALS — BP 134/79 | HR 75 | Temp 98.5°F | Wt 200.3 lb

## 2017-03-11 DIAGNOSIS — G44029 Chronic cluster headache, not intractable: Secondary | ICD-10-CM

## 2017-03-11 DIAGNOSIS — Z23 Encounter for immunization: Secondary | ICD-10-CM

## 2017-03-11 DIAGNOSIS — Z599 Problem related to housing and economic circumstances, unspecified: Secondary | ICD-10-CM

## 2017-03-11 DIAGNOSIS — Z598 Other problems related to housing and economic circumstances: Secondary | ICD-10-CM

## 2017-03-11 MED ORDER — SUMATRIPTAN SUCCINATE 6 MG/0.5ML ~~LOC~~ SOLN: SUBCUTANEOUS | 12 refills | Status: DC

## 2017-03-11 MED ORDER — VERAPAMIL HCL 80 MG PO TABS: 80.0000 mg | ORAL_TABLET | Freq: Three times a day (TID) | ORAL | 3 refills | Status: DC

## 2017-03-11 NOTE — Progress Notes (Signed)
BP 134/79 (BP Location: Left Arm, Patient Position: Sitting, Cuff Size: Large)   Pulse 75   Temp 98.5 F (36.9 C)   Wt 200 lb 5 oz (90.9 kg)   SpO2 98%   BMI 30.01 kg/m    Subjective:    Patient ID: Rhonda Blankenship, female    DOB: [birth year], 61 y.o.   MRN: [id number]  HPI: Rhonda Blankenship is a 61 y.o. female  Chief Complaint  Patient presents with  . Headache    patient has used 10L of oxygen in the past along with Imitrex injections   Cluster Headaches Duration: Since Sep 28, 1996, Started back up about 2 weeks ago with the big drop in the barometric pressure. Was having minor headaches, but now starting to come more often and having severe pain. Did do well with the imitrex injections and oxygen helped.  Onset: sudden Severity: severe Quality: sharp Frequency: intermittent Location: in the bone of the L side of her face Headache duration:hours to days Radiation: yesinto her ear Time of day headache occurs:depends Alleviating factors: ibuprofen, coffee, oxygen Aggravating factors: heat and changes in sleeping pattern Headache status at time of visit: asymptomatic Treatments attempted:verapamil Aura: no Nausea: no Vomiting: no Photophobia: yes Phonophobia: yes Effect on social functioning: yes Confusion: no Gait disturbance/ataxia: no Behavioral changes: no Fevers: no  Relevant past medical, surgical, family and social history reviewed and updated as indicated. Interim medical history since our last visit reviewed. Allergies and medications reviewed and updated.  Review of Systems  Constitutional: Negative.   Respiratory: Negative.   Cardiovascular: Negative.   Neurological: Positive for headaches. Negative for dizziness, tremors, seizures, syncope, facial asymmetry, speech difficulty, weakness, light-headedness and numbness.  Psychiatric/Behavioral: Negative.     Per HPI unless specifically indicated above     Objective:    BP 134/79 (BP  Location: Left Arm, Patient Position: Sitting, Cuff Size: Large)   Pulse 75   Temp 98.5 F (36.9 C)   Wt 200 lb 5 oz (90.9 kg)   SpO2 98%   BMI 30.01 kg/m   Wt Readings from Last 3 Encounters:  03/11/17 200 lb 5 oz (90.9 kg)  08/17/16 205 lb (93 kg)  02/17/16 202 lb 3.2 oz (91.7 kg)    Physical Exam  Constitutional: She is oriented to person, place, and time. She appears well-developed and well-nourished. No distress.  HENT:  Head: Normocephalic and atraumatic.  Right Ear: Hearing normal.  Left Ear: Hearing normal.  Nose: Nose normal.  Eyes: Conjunctivae and lids are normal. Right eye exhibits no discharge. Left eye exhibits no discharge. No scleral icterus.  Cardiovascular: Normal rate, regular rhythm, normal heart sounds and intact distal pulses.  Exam reveals no gallop and no friction rub.   No murmur heard. Pulmonary/Chest: Effort normal and breath sounds normal. No respiratory distress. She has no wheezes. She has no rales. She exhibits no tenderness.  Musculoskeletal: Normal range of motion.  Neurological: She is alert and oriented to person, place, and time.  Skin: Skin is warm, dry and intact. No rash noted. She is not diaphoretic. No erythema. No pallor.  Psychiatric: She has a normal mood and affect. Her speech is normal and behavior is normal. Judgment and thought content normal. Cognition and memory are normal.    Results for orders placed or performed in visit on 02/17/16  CBC with Differential/Platelet  Result Value Ref Range   WBC 7.2 3.4 - 10.8 x10E3/uL   RBC 4.52 3.77 - 5.28 x10E6/uL  Hemoglobin 12.8 11.1 - 15.9 g/dL   Hematocrit 81.1 91.4 - 46.6 %   MCV 85 79 - 97 fL   MCH 28.3 26.6 - 33.0 pg   MCHC 33.2 31.5 - 35.7 g/dL   RDW 78.2 95.6 - 21.3 %   Platelets 274 150 - 379 x10E3/uL   Neutrophils 59 Not Estab. %   Lymphs 31 Not Estab. %   Monocytes 7 Not Estab. %   Eos 2 Not Estab. %   Basos 1 Not Estab. %   Neutrophils Absolute 4.3 1.4 - 7.0 x10E3/uL    Lymphocytes Absolute 2.2 0.7 - 3.1 x10E3/uL   Monocytes Absolute 0.5 0.1 - 0.9 x10E3/uL   EOS (ABSOLUTE) 0.1 0.0 - 0.4 x10E3/uL   Basophils Absolute 0.0 0.0 - 0.2 x10E3/uL   Immature Granulocytes 0 Not Estab. %   Immature Grans (Abs) 0.0 0.0 - 0.1 x10E3/uL  Comprehensive metabolic panel  Result Value Ref Range   Glucose 92 65 - 99 mg/dL   BUN 12 6 - 24 mg/dL   Creatinine, Ser 0.86 0.57 - 1.00 mg/dL   GFR calc non Af Amer 97 >59 mL/min/1.73   GFR calc Af Amer 112 >59 mL/min/1.73   BUN/Creatinine Ratio 18 9 - 23   Sodium 141 134 - 144 mmol/L   Potassium 4.0 3.5 - 5.2 mmol/L   Chloride 99 96 - 106 mmol/L   CO2 26 18 - 29 mmol/L   Calcium 9.5 8.7 - 10.2 mg/dL   Total Protein 6.6 6.0 - 8.5 g/dL   Albumin 4.2 3.5 - 5.5 g/dL   Globulin, Total 2.4 1.5 - 4.5 g/dL   Albumin/Globulin Ratio 1.8 1.2 - 2.2   Bilirubin Total <0.2 0.0 - 1.2 mg/dL   Alkaline Phosphatase 84 39 - 117 IU/L   AST 10 0 - 40 IU/L   ALT 8 0 - 32 IU/L  Lipid Panel w/o Chol/HDL Ratio  Result Value Ref Range   Cholesterol, Total 211 (H) 100 - 199 mg/dL   Triglycerides 578 0 - 149 mg/dL   HDL 54 >46 mg/dL   VLDL Cholesterol Cal 30 5 - 40 mg/dL   LDL Calculated 962 (H) 0 - 99 mg/dL  TSH  Result Value Ref Range   TSH 0.960 0.450 - 4.500 uIU/mL  UA/M w/rflx Culture, Routine  Result Value Ref Range   Specific Gravity, UA 1.015 1.005 - 1.030   pH, UA 6.0 5.0 - 7.5   Color, UA Yellow Yellow   Appearance Ur Clear Clear   Leukocytes, UA Negative Negative   Protein, UA Negative Negative/Trace   Glucose, UA Negative Negative   Ketones, UA Negative Negative   RBC, UA Negative Negative   Bilirubin, UA Negative Negative   Urobilinogen, Ur 0.2 0.2 - 1.0 mg/dL   Nitrite, UA Negative Negative      Assessment & Plan:   Problem List Items Addressed This Visit      Nervous and Auditory   Cluster headaches - Primary    Worsening right now. Will continue verapamil and start imitrex. Will look into getting her oxygen PRN.  Call with any concerns.       Relevant Medications   verapamil (CALAN) 80 MG tablet   SUMAtriptan (IMITREX) 6 MG/0.5ML SOLN injection    Other Visit Diagnoses    Immunization due       Flu shot given today   Relevant Orders   Flu Vaccine QUAD 6+ mos PF IM (Fluarix Quad PF) (Completed)   Financial  difficulties       Referral to [facility] made today to try to help her get medicine less expensive.    Relevant Orders   Amb[facility] referral to [facility]       Follow up plan: Return in about 6 months (around 09/09/2017) for Physical.

## 2017-03-11 NOTE — Patient Instructions (Signed)

## 2017-03-11 NOTE — Assessment & Plan Note (Signed)
Worsening right now. Will continue verapamil and start imitrex. Will look into getting her oxygen PRN. Call with any concerns.

## 2017-03-18 ENCOUNTER — Telehealth: Payer: Self-pay | Admitting: Family Medicine

## 2017-03-18 NOTE — Telephone Encounter (Signed)
Pt asking status of update on referral to "[facility]" to get med less expensive.

## 2017-03-18 NOTE — Telephone Encounter (Signed)
Left message for patient to inform her the referral we discussued about her medications has been sent and Samara DeistKathryn will call her within 30 days of the referral to discuss options.

## 2017-03-18 NOTE — Telephone Encounter (Signed)
Copied from CRM #[id number]. Topic: Quick Communication - See Telephone Encounter >> Mar 18, 2017 11:40 AM Everardo PacificMoton, Antar Milks, NT wrote: CRM for notification. See Telephone encounter for:  03/18/17. Patient called because she has some questions about her Imitrex. She has 12 refills on the medications and wanted to know how often can she get the refills. She is taking the medication as ordered but is down to only 4 shots. Patient said that  an email was sent to someone about helping her get her medications as well. She would like it if someone could give her a call back about this matter please

## 2017-03-23 ENCOUNTER — Ambulatory Visit: Payer: Self-pay | Admitting: Family Medicine

## 2017-03-25 ENCOUNTER — Encounter: Payer: Self-pay | Admitting: Family Medicine

## 2017-03-25 MED ORDER — SUMATRIPTAN SUCCINATE 6 MG/0.5ML ~~LOC~~ SOLN: SUBCUTANEOUS | 12 refills | Status: DC

## 2017-03-28 ENCOUNTER — Encounter: Payer: Self-pay | Admitting: Emergency Medicine

## 2017-03-28 ENCOUNTER — Other Ambulatory Visit: Payer: Self-pay

## 2017-03-28 ENCOUNTER — Emergency Department
Admission: EM | Admit: 2017-03-28 | Discharge: 2017-03-28 | Disposition: A | Discharge status: Home / Self Care | Payer: Self-pay | Attending: Emergency Medicine | Admitting: Emergency Medicine

## 2017-03-28 DIAGNOSIS — Z79899 Other long term (current) drug therapy: Secondary | ICD-10-CM | POA: Insufficient documentation

## 2017-03-28 DIAGNOSIS — G43909 Migraine, unspecified, not intractable, without status migrainosus: Secondary | ICD-10-CM | POA: Insufficient documentation

## 2017-03-28 DIAGNOSIS — F17210 Nicotine dependence, cigarettes, uncomplicated: Secondary | ICD-10-CM | POA: Insufficient documentation

## 2017-03-28 MED ORDER — SUMATRIPTAN SUCCINATE 6 MG/0.5ML ~~LOC~~ SOLN
6.0000 mg | Freq: Once | SUBCUTANEOUS | Status: AC
  Administered 2017-03-28: 6 mg via SUBCUTANEOUS
  Filled 2017-03-28: qty 0.5

## 2017-03-28 NOTE — ED Provider Notes (Signed)
[facility] Emergency Department Provider Note ____________________________________________  Time seen: Approximately 2:06 PM  I have reviewed the triage vital signs and the nursing notes.   HISTORY  Chief Complaint Headache   HPI Rhonda Blankenship is a 61 y.o. female who presents to the emergency department for an injection of Imitrex. She states that she has had one of her typical migraines for the past couple of hours. The pharmacy is out of the Imitrex injections and we'll have any until tomorrow afternoon.   Location: Left occiput Similar to previous headaches: Yes Duration: 2 hours TIMING: Not associated SEVERITY: 5/10 QUALITY: Throbbing CONTEXT: Not associated MODIFYING FACTORS: None ASSOCIATED SYMPTOMS: Nausea Past Medical History:  Diagnosis Date  . Chronic headache   . Cluster headaches     Patient Active Problem List   Diagnosis Date Noted  . Acute anxiety 02/17/2016  . Cluster headaches     Past Surgical History:  Procedure Laterality Date  . ABDOMINAL HYSTERECTOMY    . APPENDECTOMY    . NOSE SURGERY    . TONSILLECTOMY      Prior to Admission medications   Medication Sig Start Date End Date Taking? Authorizing Provider  SUMAtriptan (IMITREX) 6 MG/0.5ML SOLN injection 0.425mL SQ at start of migraine May repeat in 2 hours if headache persists or recurs. Do not take more than 2 shots in 24 hours. 03/25/17   Johnson, Megan P, DO  verapamil (CALAN) 80 MG tablet Take 1 tablet (80 mg total) by mouth 3 (three) times daily. 03/11/17   Olevia PerchesJohnson, Megan P, DO    Allergies Augmentin [amoxicillin-pot clavulanate] and Codeine  Family History  Problem Relation Age of Onset  . Heart disease Mother   . Arthritis Sister   . Heart disease Brother   . Migraines Son   . Cancer Sister        Tumor  . Muscular dystrophy Brother   . Alzheimer's disease Maternal Grandmother   . Hypertension Maternal Grandmother   . Stroke Maternal Grandfather   .  Heart attack Maternal Grandfather   . Hypertension Paternal Grandfather     Social History Social History   Tobacco Use  . Smoking status: Current Every Day Smoker    Packs/day: 0.50    Types: Cigarettes  . Smokeless tobacco: Never Used  Substance Use Topics  . Alcohol use: Yes    Alcohol/week: 0.0 oz    Comment: Wine on occasion  . Drug use: No    Review of Systems Constitutional: No fever/chills or recent injury. Eyes: No visual changes. ENT: No sore throat. Respiratory: Denies shortness of breath. Gastrointestinal: No abdominal pain. Positive for nausea, no vomiting. Musculoskeletal: Negative for pain. Skin: Negative for rash. Neurological:Positive for headache, Negative for focal weakness or numbness. No confusion or fainting. ___________________________________________   PHYSICAL EXAM:  VITAL SIGNS: ED Triage Vitals  Enc Vitals Group     BP 03/28/17 1345 107/66     Pulse Rate 03/28/17 1345 91     Resp 03/28/17 1345 18     Temp 03/28/17 1345 97.6 F (36.4 C)     Temp Source 03/28/17 1345 Oral     SpO2 03/28/17 1345 100 %     Weight 03/28/17 1343 200 lb (90.7 kg)     Height 03/28/17 1343 5\' 10"  (1.778 m)     Head Circumference --      Peak Flow --      Pain Score 03/28/17 1343 10     Pain Loc --  Pain Edu? --      Excl. in GC? --     Constitutional: Alert and oriented. Well appearing and in no acute distress. Eyes: Conjunctivae are normal. PERRL. EOMI.  Head: Atraumatic. Nose: No congestion/rhinnorhea. Mouth/Throat: Mucous membranes are moist.  Oropharynx non-erythematous. Neck: No stridor. No meningismus.   Musculoskeletal: Full, active range of motion in all extremities. Neurologic:  Normal speech and language. No gross focal neurologic deficits are appreciated. No gait instability. Cranial nerves: 2-10 normal as tested. Cerebellar:Normal Romberg, finger-nose-finger, normal gait. Sensorimotor: No aphasia, pronator drift, clonus, sensory loss or  abnormal reflexes.  Skin:  Skin is warm, dry and intact. No rash noted. Psychiatric: Mood and affect are normal. Speech and behavior are normal. Normal thought process and cognition.  ____________________________________________   LABS (all labs ordered are listed, but only abnormal results are displayed)  Labs Reviewed - No data to display ____________________________________________  EKG  Not indicated ____________________________________________  RADIOLOGY  Not indicated ____________________________________________   PROCEDURES  Procedure(s) performed: None  Critical Care performed: No ____________________________________________   INITIAL IMPRESSION / ASSESSMENT AND PLAN / ED COURSE  61 year old female presenting to the emergency department for treatment of chronic headache. She is out of Imitrex and was unable to obtain any from the pharmacy. Subcutaneous injection has been ordered. Patient states that she has an active prescription, but the pharmacy is currently out of medication and will have more in tomorrow afternoon.  ----------------------------------------- 2:43 PM on 03/28/2017 -----------------------------------------  Patient reports some improvement after Imitrex. She requests discharge. She states that she will take her Excedrin Migraine and get her Imitrex injections once the pharmacy calls her tomorrow afternoon. She was encouraged to follow up with her headache specialist or primary care provider for headache that does not improve over 24 hours. She was encouraged to return to the emergency department for symptoms that change or worsen if she is unable schedule an appointment.  Pertinent labs & imaging results that were available during my care of the patient were reviewed by me and considered in my medical decision making (see chart for details). ____________________________________________   FINAL CLINICAL IMPRESSION(S) / ED DIAGNOSES  Final  diagnoses:  Migraine without status migrainosus, not intractable, unspecified migraine type      Chinita Pesterriplett, Ivonna Kinnick B, FNP 03/28/17 1444    Chinita Pesterriplett, Cassandra Harbold B, FNP 03/28/17 1445    Jeanmarie PlantMcShane, James A, MD 03/28/17 1537

## 2017-03-28 NOTE — Discharge Instructions (Signed)
Follow up with your headache specialist or primary care provider if your headache does not improve over the next 24 hours. Return to the emergency department for symptoms that change or worsen if you're unable schedule an appointment.

## 2017-03-28 NOTE — ED Triage Notes (Signed)
Pt c/o cluster headache that started at about 1100 today.  Has Imitrex injections but pharmacy is out of the medication.  This her normal headache no new symptoms pt just wants a shot of pain medication until can get shot tomorrow.  No vomiting.  Ambulatory without difficulty.

## 2017-04-12 ENCOUNTER — Ambulatory Visit: Payer: Self-pay | Admitting: Family Medicine

## 2017-08-23 ENCOUNTER — Ambulatory Visit: Payer: Self-pay | Admitting: Family Medicine

## 2017-09-06 ENCOUNTER — Encounter: Payer: Self-pay | Admitting: Family Medicine

## 2017-09-13 ENCOUNTER — Encounter: Payer: Self-pay | Admitting: Family Medicine

## 2017-09-13 ENCOUNTER — Ambulatory Visit (INDEPENDENT_AMBULATORY_CARE_PROVIDER_SITE_OTHER): Payer: Self-pay | Admitting: Family Medicine

## 2017-09-13 VITALS — BP 119/80 | HR 91 | Temp 99.0°F | Ht 68.3 in | Wt 210.0 lb

## 2017-09-13 DIAGNOSIS — Z0001 Encounter for general adult medical examination with abnormal findings: Secondary | ICD-10-CM

## 2017-09-13 DIAGNOSIS — Z1211 Encounter for screening for malignant neoplasm of colon: Secondary | ICD-10-CM

## 2017-09-13 DIAGNOSIS — Z9189 Other specified personal risk factors, not elsewhere classified: Secondary | ICD-10-CM

## 2017-09-13 DIAGNOSIS — Z1159 Encounter for screening for other viral diseases: Secondary | ICD-10-CM

## 2017-09-13 DIAGNOSIS — Z0000 Encounter for general adult medical examination without abnormal findings: Secondary | ICD-10-CM

## 2017-09-13 DIAGNOSIS — G44029 Chronic cluster headache, not intractable: Secondary | ICD-10-CM

## 2017-09-13 LAB — UA/M W/RFLX CULTURE, ROUTINE
BILIRUBIN UA: NEGATIVE
Glucose, UA: NEGATIVE
Ketones, UA: NEGATIVE
Leukocytes, UA: NEGATIVE
NITRITE UA: NEGATIVE
PH UA: 6 (ref 5.0–7.5)
PROTEIN UA: NEGATIVE
RBC, UA: NEGATIVE
SPEC GRAV UA: 1.015 (ref 1.005–1.030)
UUROB: 1 mg/dL (ref 0.2–1.0)

## 2017-09-13 MED ORDER — VERAPAMIL HCL 80 MG PO TABS: 80.0000 mg | ORAL_TABLET | Freq: Three times a day (TID) | ORAL | 3 refills | Status: DC

## 2017-09-13 MED ORDER — SUMATRIPTAN SUCCINATE 6 MG/0.5ML ~~LOC~~ SOLN
SUBCUTANEOUS | 12 refills | Status: DC
Start: 2017-09-13 — End: 2018-10-03

## 2017-09-13 NOTE — Assessment & Plan Note (Signed)
Under good control. Continue current regimen. Continue to monitor. Call with any concerns. 

## 2017-09-13 NOTE — Progress Notes (Signed)
BP 119/80 (BP Location: Left Arm, Patient Position: Sitting, Cuff Size: Normal)   Pulse 91   Temp 99 F (37.2 C)   Ht 5' 8.3" (1.735 m)   Wt 210 lb (95.3 kg)   SpO2 98%   BMI 31.65 kg/m    Subjective:    Patient ID: Rhonda Blankenship, female    DOB: April 14, 1956, 62 y.o.   MRN: [id number]  HPI: Rhonda Blankenship is a 62 y.o. female presenting on 09/13/2017 for comprehensive medical examination. Current medical complaints include: Headaches are under good control. Doing well on the medicine. No side effects. No concerns.   Menopausal Symptoms: no  Depression Screen done today and results listed below:  Depression screen [facility] 2/9 09/13/2017 02/17/2016  Decreased Interest 0 1  Down, Depressed, Hopeless 0 1  PHQ - 2 Score 0 2  Altered sleeping 0 -  Tired, decreased energy 0 -  Change in appetite 0 -  Feeling bad or failure about yourself  0 -  Trouble concentrating 0 -  Moving slowly or fidgety/restless 0 -  Suicidal thoughts 0 -  PHQ-9 Score 0 -  Difficult doing work/chores Not difficult at all -    Past Medical History:  Past Medical History:  Diagnosis Date  . Chronic headache   . Cluster headaches     Surgical History:  Past Surgical History:  Procedure Laterality Date  . ABDOMINAL HYSTERECTOMY    . APPENDECTOMY    . NOSE SURGERY    . TONSILLECTOMY      Medications:  No current outpatient medications on file prior to visit.   No current facility-administered medications on file prior to visit.     Allergies:  Allergies  Allergen Reactions  . Augmentin [Amoxicillin-Pot Clavulanate] Nausea And Vomiting  . Codeine Nausea And Vomiting    Social History:  Social History   Socioeconomic History  . Marital status: Single    Spouse name: Not on file  . Number of children: Not on file  . Years of education: Not on file  . Highest education level: Not on file  Occupational History  . Not on file  Social Needs  . Financial resource strain: Not on file  . Food  insecurity:    Worry: Not on file    Inability: Not on file  . Transportation needs:    Medical: Not on file    Non-medical: Not on file  Tobacco Use  . Smoking status: Current Every Day Smoker    Packs/day: 0.50    Types: Cigarettes  . Smokeless tobacco: Never Used  Substance and Sexual Activity  . Alcohol use: Yes    Alcohol/week: 0.0 oz    Comment: Wine on occasion  . Drug use: No  . Sexual activity: Not on file  Lifestyle  . Physical activity:    Days per week: Not on file    Minutes per session: Not on file  . Stress: Not on file  Relationships  . Social connections:    Talks on phone: Not on file    Gets together: Not on file    Attends religious service: Not on file    Active member of club or organization: Not on file    Attends meetings of clubs or organizations: Not on file    Relationship status: Not on file  . Intimate partner violence:    Fear of current or ex partner: Not on file    Emotionally abused: Not on file    Physically abused:  Not on file    Forced sexual activity: Not on file  Other Topics Concern  . Not on file  Social History Narrative  . Not on file   Social History   Tobacco Use  Smoking Status Current Every Day Smoker  . Packs/day: 0.50  . Types: Cigarettes  Smokeless Tobacco Never Used   Social History   Substance and Sexual Activity  Alcohol Use Yes  . Alcohol/week: 0.0 oz   Comment: Wine on occasion    Family History:  Family History  Problem Relation Age of Onset  . Heart disease Mother   . Arthritis Sister   . Heart disease Brother   . Migraines Son   . Cancer Sister        Tumor  . Muscular dystrophy Brother   . Alzheimer's disease Maternal Grandmother   . Hypertension Maternal Grandmother   . Stroke Maternal Grandfather   . Heart attack Maternal Grandfather   . Hypertension Paternal Grandfather     Past medical history, surgical history, medications, allergies, family history and social history reviewed with  patient today and changes made to appropriate areas of the chart.   Review of Systems  Constitutional: Negative.   HENT: Negative.   Eyes: Negative.   Respiratory: Positive for cough and shortness of breath. Negative for hemoptysis, sputum production and wheezing.   Cardiovascular: Negative.   Gastrointestinal: Positive for constipation and heartburn. Negative for abdominal pain, blood in stool, diarrhea, melena, nausea and vomiting.  Genitourinary: Negative.   Musculoskeletal: Negative.   Skin: Negative.        Going to dermatologist   Neurological: Positive for headaches. Negative for dizziness, tingling, tremors, sensory change, speech change, focal weakness, seizures, loss of consciousness and weakness.  Endo/Heme/Allergies: Positive for environmental allergies. Negative for polydipsia. Does not bruise/bleed easily.  Psychiatric/Behavioral: Negative.     All other ROS negative except what is listed above and in the HPI.      Objective:    BP 119/80 (BP Location: Left Arm, Patient Position: Sitting, Cuff Size: Normal)   Pulse 91   Temp 99 F (37.2 C)   Ht 5' 8.3" (1.735 m)   Wt 210 lb (95.3 kg)   SpO2 98%   BMI 31.65 kg/m   Wt Readings from Last 3 Encounters:  09/13/17 210 lb (95.3 kg)  03/28/17 200 lb (90.7 kg)  03/11/17 200 lb 5 oz (90.9 kg)    Physical Exam  Constitutional: She is oriented to person, place, and time. She appears well-developed and well-nourished. No distress.  HENT:  Head: Normocephalic and atraumatic.  Right Ear: Hearing, tympanic membrane, external ear and ear canal normal.  Left Ear: Hearing, tympanic membrane, external ear and ear canal normal.  Nose: Nose normal.  Mouth/Throat: Uvula is midline, oropharynx is clear and moist and mucous membranes are normal. No oropharyngeal exudate.  Eyes: Pupils are equal, round, and reactive to light. Conjunctivae, EOM and lids are normal. Right eye exhibits no discharge. Left eye exhibits no discharge. No  scleral icterus.  Neck: Normal range of motion. Neck supple. No JVD present. No tracheal deviation present. No thyromegaly present.  Cardiovascular: Normal rate, regular rhythm, normal heart sounds and intact distal pulses. Exam reveals no gallop and no friction rub.  No murmur heard. Pulmonary/Chest: Effort normal and breath sounds normal. No stridor. No respiratory distress. She has no wheezes. She has no rales. She exhibits no tenderness. Right breast exhibits no inverted nipple, no mass, no nipple discharge, no skin  change and no tenderness. Left breast exhibits no inverted nipple, no mass, no nipple discharge, no skin change and no tenderness. No breast swelling, tenderness, discharge or bleeding. Breasts are symmetrical.  Abdominal: Soft. Bowel sounds are normal. She exhibits no distension and no mass. There is no tenderness. There is no rebound and no guarding. No hernia.  Genitourinary: No breast swelling, tenderness, discharge or bleeding.  Genitourinary Comments: Pelvic exam deferred with shared decision making   Musculoskeletal: Normal range of motion. She exhibits no edema, tenderness or deformity.  Lymphadenopathy:    She has no cervical adenopathy.  Neurological: She is alert and oriented to person, place, and time. She displays normal reflexes. No cranial nerve deficit or sensory deficit. She exhibits normal muscle tone. Coordination normal.  Skin: Skin is warm, dry and intact. Capillary refill takes less than 2 seconds. No rash noted. She is not diaphoretic. No erythema. No pallor.  Psychiatric: She has a normal mood and affect. Her speech is normal and behavior is normal. Judgment and thought content normal. Cognition and memory are normal.  Nursing note and vitals reviewed.   Results for orders placed or performed in visit on 02/17/16  CBC with Differential/Platelet  Result Value Ref Range   WBC 7.2 3.4 - 10.8 x10E3/uL   RBC 4.52 3.77 - 5.28 x10E6/uL   Hemoglobin 12.8 11.1 -  15.9 g/dL   Hematocrit 62.9 52.8 - 46.6 %   MCV 85 79 - 97 fL   MCH 28.3 26.6 - 33.0 pg   MCHC 33.2 31.5 - 35.7 g/dL   RDW 41.3 24.4 - 01.0 %   Platelets 274 150 - 379 x10E3/uL   Neutrophils 59 Not Estab. %   Lymphs 31 Not Estab. %   Monocytes 7 Not Estab. %   Eos 2 Not Estab. %   Basos 1 Not Estab. %   Neutrophils Absolute 4.3 1.4 - 7.0 x10E3/uL   Lymphocytes Absolute 2.2 0.7 - 3.1 x10E3/uL   Monocytes Absolute 0.5 0.1 - 0.9 x10E3/uL   EOS (ABSOLUTE) 0.1 0.0 - 0.4 x10E3/uL   Basophils Absolute 0.0 0.0 - 0.2 x10E3/uL   Immature Granulocytes 0 Not Estab. %   Immature Grans (Abs) 0.0 0.0 - 0.1 x10E3/uL  Comprehensive metabolic panel  Result Value Ref Range   Glucose 92 65 - 99 mg/dL   BUN 12 6 - 24 mg/dL   Creatinine, Ser 2.72 0.57 - 1.00 mg/dL   GFR calc non Af Amer 97 >59 mL/min/1.73   GFR calc Af Amer 112 >59 mL/min/1.73   BUN/Creatinine Ratio 18 9 - 23   Sodium 141 134 - 144 mmol/L   Potassium 4.0 3.5 - 5.2 mmol/L   Chloride 99 96 - 106 mmol/L   CO2 26 18 - 29 mmol/L   Calcium 9.5 8.7 - 10.2 mg/dL   Total Protein 6.6 6.0 - 8.5 g/dL   Albumin 4.2 3.5 - 5.5 g/dL   Globulin, Total 2.4 1.5 - 4.5 g/dL   Albumin/Globulin Ratio 1.8 1.2 - 2.2   Bilirubin Total <0.2 0.0 - 1.2 mg/dL   Alkaline Phosphatase 84 39 - 117 IU/L   AST 10 0 - 40 IU/L   ALT 8 0 - 32 IU/L  Lipid Panel w/o Chol/HDL Ratio  Result Value Ref Range   Cholesterol, Total 211 (H) 100 - 199 mg/dL   Triglycerides 536 0 - 149 mg/dL   HDL 54 >64 mg/dL   VLDL Cholesterol Cal 30 5 - 40 mg/dL   LDL Calculated  127 (H) 0 - 99 mg/dL  TSH  Result Value Ref Range   TSH 0.960 0.450 - 4.500 uIU/mL  UA/M w/rflx Culture, Routine  Result Value Ref Range   Specific Gravity, UA 1.015 1.005 - 1.030   pH, UA 6.0 5.0 - 7.5   Color, UA Yellow Yellow   Appearance Ur Clear Clear   Leukocytes, UA Negative Negative   Protein, UA Negative Negative/Trace   Glucose, UA Negative Negative   Ketones, UA Negative Negative   RBC, UA  Negative Negative   Bilirubin, UA Negative Negative   Urobilinogen, Ur 0.2 0.2 - 1.0 mg/dL   Nitrite, UA Negative Negative      Assessment & Plan:   Problem List Items Addressed This Visit      Nervous and Auditory   Cluster headaches    Under good control. Continue current regimen. Continue to monitor. Call with any concerns.       Relevant Medications   SUMAtriptan (IMITREX) 6 MG/0.5ML SOLN injection   verapamil (CALAN) 80 MG tablet    Other Visit Diagnoses    Routine general medical examination at a health care facility    -  Primary   Vaccines up to date. Screening labs checked today. Pap N/A. Mammogram ordered. FOBT given today. Call with any concerns. Continue to monitor.    Relevant Orders   CBC with Differential/Platelet   Comprehensive metabolic panel   Lipid Panel w/o Chol/HDL Ratio   TSH   UA/M w/rflx Culture, Routine   Encounter for hepatitis C virus screening test for high risk patient       Labs drawn today. Await results.    Relevant Orders   Hepatitis C Antibody   Screening for colon cancer       FOBT x3 given today   Relevant Orders   IFOBT POC (occult bld, rslt in office)       Follow up plan: Return in about 1 year (around 09/14/2018) for Physical.   LABORATORY TESTING:  - Pap smear: not applicable  IMMUNIZATIONS:   - Tdap: Tetanus vaccination status reviewed: last tetanus booster within 10 years. - Influenza: Up to date - Pneumovax: Up to date - Prevnar: Not applicable - HPV: Not applicable - Zostavax vaccine: Refused  SCREENING: -Mammogram: Ordered today  - Colonoscopy: FOBT- Cards given today   PATIENT COUNSELING:   Advised to take 1 mg of folate supplement per day if capable of pregnancy.   Sexuality: Discussed sexually transmitted diseases, partner selection, use of condoms, avoidance of unintended pregnancy  and contraceptive alternatives.   Advised to avoid cigarette smoking.  I discussed with the patient that most people  either abstain from alcohol or drink within safe limits (<=14/week and <=4 drinks/occasion for males, <=7/weeks and <= 3 drinks/occasion for females) and that the risk for alcohol disorders and other health effects rises proportionally with the number of drinks per week and how often a drinker exceeds daily limits.  Discussed cessation/primary prevention of drug use and availability of treatment for abuse.   Diet: Encouraged to adjust caloric intake to maintain  or achieve ideal body weight, to reduce intake of dietary saturated fat and total fat, to limit sodium intake by avoiding high sodium foods and not adding table salt, and to maintain adequate dietary potassium and calcium preferably from fresh fruits, vegetables, and low-fat dairy products.    stressed the importance of regular exercise  Injury prevention: Discussed safety belts, safety helmets, smoke detector, smoking near bedding or upholstery.  Dental health: Discussed importance of regular tooth brushing, flossing, and dental visits.    NEXT PREVENTATIVE PHYSICAL DUE IN 1 YEAR. Return in about 1 year (around 09/14/2018) for Physical.

## 2017-09-13 NOTE — Patient Instructions (Addendum)
Please contact [place] [facility] at [phone] Health Maintenance, Female Adopting a healthy lifestyle and getting preventive care can go a long way to promote health and wellness. Talk with your health care provider about what schedule of regular examinations is right for you. This is a good chance for you to check in with your provider about disease prevention and staying healthy. In between checkups, there are plenty of things you can do on your own. Experts have done a lot of research about which lifestyle changes and preventive measures are most likely to keep you healthy. Ask your health care provider for more information. Weight and diet Eat a healthy diet  Be sure to include plenty of vegetables, fruits, low-fat dairy products, and lean protein.  Do not eat a lot of foods high in solid fats, added sugars, or salt.  Get regular exercise. This is one of the most important things you can do for your health. ? Most adults should exercise for at least 150 minutes each week. The exercise should increase your heart rate and make you sweat (moderate-intensity exercise). ? Most adults should also do strengthening exercises at least twice a week. This is in addition to the moderate-intensity exercise.  Maintain a healthy weight  Body mass index (BMI) is a measurement that can be used to identify possible weight problems. It estimates body fat based on height and weight. Your health care provider can help determine your BMI and help you achieve or maintain a healthy weight.  For females 58 years of age and older: ? A BMI below 18.5 is considered underweight. ? A BMI of 18.5 to 24.9 is normal. ? A BMI of 25 to 29.9 is considered overweight. ? A BMI of 30 and above is considered obese.  Watch levels of cholesterol and blood lipids  You should start having your blood tested for lipids and cholesterol at 60 years of age, then have this test every 5 years.  You may need to  have your cholesterol levels checked more often if: ? Your lipid or cholesterol levels are high. ? You are older than 60 years of age. ? You are at high risk for heart disease.  Cancer screening Lung Cancer  Lung cancer screening is recommended for adults 52-78 years old who are at high risk for lung cancer because of a history of smoking.  A yearly low-dose CT scan of the lungs is recommended for people who: ? Currently smoke. ? Have quit within the past 15 years. ? Have at least a 30-pack-year history of smoking. A pack year is smoking an average of one pack of cigarettes a day for 1 year.  Yearly screening should continue until it has been 15 years since you quit.  Yearly screening should stop if you develop a health problem that would prevent you from having lung cancer treatment.  Breast Cancer  Practice breast self-awareness. This means understanding how your breasts normally appear and feel.  It also means doing regular breast self-exams. Let your health care provider know about any changes, no matter how small.  If you are in your 20s or 30s, you should have a clinical breast exam (CBE) by a health care provider every 1-3 years as part of a regular health exam.  If you are 29 or older, have a CBE every year. Also consider having a breast X-ray (mammogram) every year.  If you have a family history of breast cancer, talk to your health care provider about genetic  screening.  If you are at high risk for breast cancer, talk to your health care provider about having an MRI and a mammogram every year.  Breast cancer gene (BRCA) assessment is recommended for women who have family members with BRCA-related cancers. BRCA-related cancers include: ? Breast. ? Ovarian. ? Tubal. ? Peritoneal cancers.  Results of the assessment will determine the need for genetic counseling and BRCA1 and BRCA2 testing.  Cervical Cancer Your health care provider may recommend that you be screened  regularly for cancer of the pelvic organs (ovaries, uterus, and vagina). This screening involves a pelvic examination, including checking for microscopic changes to the surface of your cervix (Pap test). You may be encouraged to have this screening done every 3 years, beginning at age 51.  For women ages 56-65, health care providers may recommend pelvic exams and Pap testing every 3 years, or they may recommend the Pap and pelvic exam, combined with testing for human papilloma virus (HPV), every 5 years. Some types of HPV increase your risk of cervical cancer. Testing for HPV may also be done on women of any age with unclear Pap test results.  Other health care providers may not recommend any screening for nonpregnant women who are considered low risk for pelvic cancer and who do not have symptoms. Ask your health care provider if a screening pelvic exam is right for you.  If you have had past treatment for cervical cancer or a condition that could lead to cancer, you need Pap tests and screening for cancer for at least 20 years after your treatment. If Pap tests have been discontinued, your risk factors (such as having a new sexual partner) need to be reassessed to determine if screening should resume. Some women have medical problems that increase the chance of getting cervical cancer. In these cases, your health care provider may recommend more frequent screening and Pap tests.  Colorectal Cancer  This type of cancer can be detected and often prevented.  Routine colorectal cancer screening usually begins at 60 years of age and continues through 60 years of age.  Your health care provider may recommend screening at an earlier age if you have risk factors for colon cancer.  Your health care provider may also recommend using home test kits to check for hidden blood in the stool.  A small camera at the end of a tube can be used to examine your colon directly (sigmoidoscopy or colonoscopy). This is  done to check for the earliest forms of colorectal cancer.  Routine screening usually begins at age 30.  Direct examination of the colon should be repeated every 5-10 years through 60 years of age. However, you may need to be screened more often if early forms of precancerous polyps or small growths are found.  Skin Cancer  Check your skin from head to toe regularly.  Tell your health care provider about any new moles or changes in moles, especially if there is a change in a mole's shape or color.  Also tell your health care provider if you have a mole that is larger than the size of a pencil eraser.  Always use sunscreen. Apply sunscreen liberally and repeatedly throughout the day.  Protect yourself by wearing long sleeves, pants, a wide-brimmed hat, and sunglasses whenever you are outside.  Heart disease, diabetes, and high blood pressure  High blood pressure causes heart disease and increases the risk of stroke. High blood pressure is more likely to develop in: ? People who  have blood pressure in the high end of the normal range (130-139/85-89 mm Hg). ? People who are overweight or obese. ? People who are African American.  If you are 22-73 years of age, have your blood pressure checked every 3-5 years. If you are 69 years of age or older, have your blood pressure checked every year. You should have your blood pressure measured twice-once when you are at a hospital or clinic, and once when you are not at a hospital or clinic. Record the average of the two measurements. To check your blood pressure when you are not at a hospital or clinic, you can use: ? An automated blood pressure machine at a pharmacy. ? A home blood pressure monitor.  If you are between 73 years and 83 years old, ask your health care provider if you should take aspirin to prevent strokes.  Have regular diabetes screenings. This involves taking a blood sample to check your fasting blood sugar level. ? If you are  at a normal weight and have a low risk for diabetes, have this test once every three years after 60 years of age. ? If you are overweight and have a high risk for diabetes, consider being tested at a younger age or more often. Preventing infection Hepatitis B  If you have a higher risk for hepatitis B, you should be screened for this virus. You are considered at high risk for hepatitis B if: ? You were born in a country where hepatitis B is common. Ask your health care provider which countries are considered high risk. ? Your parents were born in a high-risk country, and you have not been immunized against hepatitis B (hepatitis B vaccine). ? You have HIV or AIDS. ? You use needles to inject street drugs. ? You live with someone who has hepatitis B. ? You have had sex with someone who has hepatitis B. ? You get hemodialysis treatment. ? You take certain medicines for conditions, including cancer, organ transplantation, and autoimmune conditions.  Hepatitis C  Blood testing is recommended for: ? Everyone born from [birth year] through 1963. ? Anyone with known risk factors for hepatitis C.  Sexually transmitted infections (STIs)  You should be screened for sexually transmitted infections (STIs) including gonorrhea and chlamydia if: ? You are sexually active and are younger than 60 years of age. ? You are older than 60 years of age and your health care provider tells you that you are at risk for this type of infection. ? Your sexual activity has changed since you were last screened and you are at an increased risk for chlamydia or gonorrhea. Ask your health care provider if you are at risk.  If you do not have HIV, but are at risk, it may be recommended that you take a prescription medicine daily to prevent HIV infection. This is called pre-exposure prophylaxis (PrEP). You are considered at risk if: ? You are sexually active and do not regularly use condoms or know the HIV status of your  partner(s). ? You take drugs by injection. ? You are sexually active with a partner who has HIV.  Talk with your health care provider about whether you are at high risk of being infected with HIV. If you choose to begin PrEP, you should first be tested for HIV. You should then be tested every 3 months for as long as you are taking PrEP. Pregnancy  If you are premenopausal and you may become pregnant, ask your health care provider  about preconception counseling.  If you may become pregnant, take 400 to 800 micrograms (mcg) of folic acid every day.  If you want to prevent pregnancy, talk to your health care provider about birth control (contraception). Osteoporosis and menopause  Osteoporosis is a disease in which the bones lose minerals and strength with aging. This can result in serious bone fractures. Your risk for osteoporosis can be identified using a bone density scan.  If you are 76 years of age or older, or if you are at risk for osteoporosis and fractures, ask your health care provider if you should be screened.  Ask your health care provider whether you should take a calcium or vitamin D supplement to lower your risk for osteoporosis.  Menopause may have certain physical symptoms and risks.  Hormone replacement therapy may reduce some of these symptoms and risks. Talk to your health care provider about whether hormone replacement therapy is right for you. Follow these instructions at home:  Schedule regular health, dental, and eye exams.  Stay current with your immunizations.  Do not use any tobacco products including cigarettes, chewing tobacco, or electronic cigarettes.  If you are pregnant, do not drink alcohol.  If you are breastfeeding, limit how much and how often you drink alcohol.  Limit alcohol intake to no more than 1 drink per day for nonpregnant women. One drink equals 12 ounces of beer, 5 ounces of wine, or 1 ounces of hard liquor.  Do not use street  drugs.  Do not share needles.  Ask your health care provider for help if you need support or information about quitting drugs.  Tell your health care provider if you often feel depressed.  Tell your health care provider if you have ever been abused or do not feel safe at home. This information is not intended to replace advice given to you by your health care provider. Make sure you discuss any questions you have with your health care provider. Document Released: 11/16/2010 Document Revised: 10/09/2015 Document Reviewed: 02/04/2015 Elsevier Interactive Patient Education  2018 [employer] Maintenance for Postmenopausal Women Menopause is a normal process in which your reproductive ability comes to an end. This process happens gradually over a span of months to years, usually between the ages of 48 and 24. Menopause is complete when you have missed 12 consecutive menstrual periods. It is important to talk with your health care provider about some of the most common conditions that affect postmenopausal women, such as heart disease, cancer, and bone loss (osteoporosis). Adopting a healthy lifestyle and getting preventive care can help to promote your health and wellness. Those actions can also lower your chances of developing some of these common conditions. What should I know about menopause? During menopause, you may experience a number of symptoms, such as:  Moderate-to-severe hot flashes.  Night sweats.  Decrease in sex drive.  Mood swings.  Headaches.  Tiredness.  Irritability.  Memory problems.  Insomnia.  Choosing to treat or not to treat menopausal changes is an individual decision that you make with your health care provider. What should I know about hormone replacement therapy and supplements? Hormone therapy products are effective for treating symptoms that are associated with menopause, such as hot flashes and night sweats. Hormone replacement carries  certain risks, especially as you become older. If you are thinking about using estrogen or estrogen with progestin treatments, discuss the benefits and risks with your health care provider. What should I know about heart disease  and stroke? Heart disease, heart attack, and stroke become more likely as you age. This may be due, in part, to the hormonal changes that your body experiences during menopause. These can affect how your body processes dietary fats, triglycerides, and cholesterol. Heart attack and stroke are both medical emergencies. There are many things that you can do to help prevent heart disease and stroke:  Have your blood pressure checked at least every 1-2 years. High blood pressure causes heart disease and increases the risk of stroke.  If you are 82-5 years old, ask your health care provider if you should take aspirin to prevent a heart attack or a stroke.  Do not use any tobacco products, including cigarettes, chewing tobacco, or electronic cigarettes. If you need help quitting, ask your health care provider.  It is important to eat a healthy diet and maintain a healthy weight. ? Be sure to include plenty of vegetables, fruits, low-fat dairy products, and lean protein. ? Avoid eating foods that are high in solid fats, added sugars, or salt (sodium).  Get regular exercise. This is one of the most important things that you can do for your health. ? Try to exercise for at least 150 minutes each week. The type of exercise that you do should increase your heart rate and make you sweat. This is known as moderate-intensity exercise. ? Try to do strengthening exercises at least twice each week. Do these in addition to the moderate-intensity exercise.  Know your numbers.Ask your health care provider to check your cholesterol and your blood glucose. Continue to have your blood tested as directed by your health care provider.  What should I know about cancer screening? There are  several types of cancer. Take the following steps to reduce your risk and to catch any cancer development as early as possible. Breast Cancer  Practice breast self-awareness. ? This means understanding how your breasts normally appear and feel. ? It also means doing regular breast self-exams. Let your health care provider know about any changes, no matter how small.  If you are 73 or older, have a clinician do a breast exam (clinical breast exam or CBE) every year. Depending on your age, family history, and medical history, it may be recommended that you also have a yearly breast X-ray (mammogram).  If you have a family history of breast cancer, talk with your health care provider about genetic screening.  If you are at high risk for breast cancer, talk with your health care provider about having an MRI and a mammogram every year.  Breast cancer (BRCA) gene test is recommended for women who have family members with BRCA-related cancers. Results of the assessment will determine the need for genetic counseling and BRCA1 and for BRCA2 testing. BRCA-related cancers include these types: ? Breast. This occurs in males or females. ? Ovarian. ? Tubal. This may also be called fallopian tube cancer. ? Cancer of the abdominal or pelvic lining (peritoneal cancer). ? Prostate. ? Pancreatic.  Cervical, Uterine, and Ovarian Cancer Your health care provider may recommend that you be screened regularly for cancer of the pelvic organs. These include your ovaries, uterus, and vagina. This screening involves a pelvic exam, which includes checking for microscopic changes to the surface of your cervix (Pap test).  For women ages 21-65, health care providers may recommend a pelvic exam and a Pap test every three years. For women ages 20-65, they may recommend the Pap test and pelvic exam, combined with testing for human  papilloma virus (HPV), every five years. Some types of HPV increase your risk of cervical  cancer. Testing for HPV may also be done on women of any age who have unclear Pap test results.  Other health care providers may not recommend any screening for nonpregnant women who are considered low risk for pelvic cancer and have no symptoms. Ask your health care provider if a screening pelvic exam is right for you.  If you have had past treatment for cervical cancer or a condition that could lead to cancer, you need Pap tests and screening for cancer for at least 20 years after your treatment. If Pap tests have been discontinued for you, your risk factors (such as having a new sexual partner) need to be reassessed to determine if you should start having screenings again. Some women have medical problems that increase the chance of getting cervical cancer. In these cases, your health care provider may recommend that you have screening and Pap tests more often.  If you have a family history of uterine cancer or ovarian cancer, talk with your health care provider about genetic screening.  If you have vaginal bleeding after reaching menopause, tell your health care provider.  There are currently no reliable tests available to screen for ovarian cancer.  Lung Cancer Lung cancer screening is recommended for adults 27-50 years old who are at high risk for lung cancer because of a history of smoking. A yearly low-dose CT scan of the lungs is recommended if you:  Currently smoke.  Have a history of at least 30 pack-years of smoking and you currently smoke or have quit within the past 15 years. A pack-year is smoking an average of one pack of cigarettes per day for one year.  Yearly screening should:  Continue until it has been 15 years since you quit.  Stop if you develop a health problem that would prevent you from having lung cancer treatment.  Colorectal Cancer  This type of cancer can be detected and can often be prevented.  Routine colorectal cancer screening usually begins at age 59  and continues through age 67.  If you have risk factors for colon cancer, your health care provider may recommend that you be screened at an earlier age.  If you have a family history of colorectal cancer, talk with your health care provider about genetic screening.  Your health care provider may also recommend using home test kits to check for hidden blood in your stool.  A small camera at the end of a tube can be used to examine your colon directly (sigmoidoscopy or colonoscopy). This is done to check for the earliest forms of colorectal cancer.  Direct examination of the colon should be repeated every 5-10 years until age 97. However, if early forms of precancerous polyps or small growths are found or if you have a family history or genetic risk for colorectal cancer, you may need to be screened more often.  Skin Cancer  Check your skin from head to toe regularly.  Monitor any moles. Be sure to tell your health care provider: ? About any new moles or changes in moles, especially if there is a change in a mole's shape or color. ? If you have a mole that is larger than the size of a pencil eraser.  If any of your family members has a history of skin cancer, especially at a young age, talk with your health care provider about genetic screening.  Always use sunscreen. Apply  sunscreen liberally and repeatedly throughout the day.  Whenever you are outside, protect yourself by wearing long sleeves, pants, a wide-brimmed hat, and sunglasses.  What should I know about osteoporosis? Osteoporosis is a condition in which bone destruction happens more quickly than new bone creation. After menopause, you may be at an increased risk for osteoporosis. To help prevent osteoporosis or the bone fractures that can happen because of osteoporosis, the following is recommended:  If you are 36-21 years old, get at least 1,000 mg of calcium and at least 600 mg of vitamin D per day.  If you are older than  age 23 but younger than age 35, get at least 1,200 mg of calcium and at least 600 mg of vitamin D per day.  If you are older than age 42, get at least 1,200 mg of calcium and at least 800 mg of vitamin D per day.  Smoking and excessive alcohol intake increase the risk of osteoporosis. Eat foods that are rich in calcium and vitamin D, and do weight-bearing exercises several times each week as directed by your health care provider. What should I know about how menopause affects my mental health? Depression may occur at any age, but it is more common as you become older. Common symptoms of depression include:  Low or sad mood.  Changes in sleep patterns.  Changes in appetite or eating patterns.  Feeling an overall lack of motivation or enjoyment of activities that you previously enjoyed.  Frequent crying spells.  Talk with your health care provider if you think that you are experiencing depression. What should I know about immunizations? It is important that you get and maintain your immunizations. These include:  Tetanus, diphtheria, and pertussis (Tdap) booster vaccine.  Influenza every year before the flu season begins.  Pneumonia vaccine.  Shingles vaccine.  Your health care provider may also recommend other immunizations. This information is not intended to replace advice given to you by your health care provider. Make sure you discuss any questions you have with your health care provider. Document Released: 06/25/2005 Document Revised: 11/21/2015 Document Reviewed: 02/04/2015 Elsevier Interactive Patient Education  2018 [employer].

## 2017-09-14 LAB — COMPREHENSIVE METABOLIC PANEL
ALBUMIN: 4.4 g/dL (ref 3.6–4.8)
ALK PHOS: 106 IU/L (ref 39–117)
ALT: 10 IU/L (ref 0–32)
AST: 9 IU/L (ref 0–40)
Albumin/Globulin Ratio: 1.9 (ref 1.2–2.2)
BUN / CREAT RATIO: 16 (ref 12–28)
BUN: 11 mg/dL (ref 8–27)
Bilirubin Total: <0.2 mg/dL (ref 0.0–1.2)
CALCIUM: 9.5 mg/dL (ref 8.7–10.3)
CO2: 24 mmol/L (ref 20–29)
CREATININE: 0.68 mg/dL (ref 0.57–1.00)
Chloride: 104 mmol/L (ref 96–106)
GFR calc Af Amer: 110 mL/min/{1.73_m2} (ref >59)
GFR, EST NON AFRICAN AMERICAN: 95 mL/min/{1.73_m2} (ref >59)
GLOBULIN, TOTAL: 2.3 g/dL (ref 1.5–4.5)
Glucose: 79 mg/dL (ref 65–99)
POTASSIUM: 4.3 mmol/L (ref 3.5–5.2)
SODIUM: 144 mmol/L (ref 134–144)
Total Protein: 6.7 g/dL (ref 6.0–8.5)

## 2017-09-14 LAB — LIPID PANEL W/O CHOL/HDL RATIO
Cholesterol, Total: 221 mg/dL — ABNORMAL HIGH (ref 100–199)
HDL: 52 mg/dL (ref >39)
LDL CALC: 150 mg/dL — AB (ref 0–99)
Triglycerides: 93 mg/dL (ref 0–149)
VLDL Cholesterol Cal: 19 mg/dL (ref 5–40)

## 2017-09-14 LAB — CBC WITH DIFFERENTIAL/PLATELET
BASOS: 0 %
Basophils Absolute: 0 10*3/uL (ref 0.0–0.2)
EOS (ABSOLUTE): 0.1 10*3/uL (ref 0.0–0.4)
EOS: 1 %
HEMATOCRIT: 39.8 % (ref 34.0–46.6)
Hemoglobin: 13.2 g/dL (ref 11.1–15.9)
Immature Grans (Abs): 0 10*3/uL (ref 0.0–0.1)
Immature Granulocytes: 0 %
LYMPHS ABS: 1.5 10*3/uL (ref 0.7–3.1)
Lymphs: 23 %
MCH: 27.8 pg (ref 26.6–33.0)
MCHC: 33.2 g/dL (ref 31.5–35.7)
MCV: 84 fL (ref 79–97)
Monocytes Absolute: 0.5 10*3/uL (ref 0.1–0.9)
Monocytes: 7 %
Neutrophils Absolute: 4.5 10*3/uL (ref 1.4–7.0)
Neutrophils: 69 %
Platelets: 268 10*3/uL (ref 150–379)
RBC: 4.75 x10E6/uL (ref 3.77–5.28)
RDW: 14.3 % (ref 12.3–15.4)
WBC: 6.6 10*3/uL (ref 3.4–10.8)

## 2017-09-14 LAB — TSH: TSH: 1.03 u[IU]/mL (ref 0.450–4.500)

## 2018-09-18 ENCOUNTER — Encounter: Payer: Self-pay | Admitting: Family Medicine

## 2018-10-03 ENCOUNTER — Ambulatory Visit (INDEPENDENT_AMBULATORY_CARE_PROVIDER_SITE_OTHER): Payer: Self-pay | Admitting: Family Medicine

## 2018-10-03 ENCOUNTER — Other Ambulatory Visit: Payer: Self-pay

## 2018-10-03 ENCOUNTER — Encounter: Payer: Self-pay | Admitting: Family Medicine

## 2018-10-03 VITALS — BP 139/83 | HR 82 | Ht 70.0 in | Wt 209.0 lb

## 2018-10-03 DIAGNOSIS — G44029 Chronic cluster headache, not intractable: Secondary | ICD-10-CM

## 2018-10-03 MED ORDER — SUMATRIPTAN SUCCINATE 6 MG/0.5ML ~~LOC~~ SOLN: SUBCUTANEOUS | 12 refills | Status: DC

## 2018-10-03 MED ORDER — VERAPAMIL HCL 80 MG PO TABS: 80.0000 mg | ORAL_TABLET | Freq: Three times a day (TID) | ORAL | 0 refills | Status: DC

## 2018-10-03 NOTE — Progress Notes (Signed)
BP 139/83   Pulse 82   Ht 5\' 10"  (1.778 m)   Wt 209 lb (94.8 kg)   SpO2 99%   BMI 29.99 kg/m    Subjective:    Patient ID: Rhonda BudgeFaye Grzesiak, female    DOB: Jan 24, 1956, 63 y.o.   MRN: [id number]  HPI: Rhonda Blankenship is a 63 y.o. female  Chief Complaint  Patient presents with  . Headache   Ran out of her medicine back in January and has been having problems with her headaches. Headaches have been on the R side. Worse since January. No changes in her vision.  MIGRAINES Duration: chronic Onset: sudden Severity: severe Quality: stabbing and throbbing Frequency: intermittent Location: R side Headache duration: 10-20 minutes Radiation: no Headache status at time of visit: current headache Aura: no Nausea:  no Vomiting: no Photophobia:  no Phonophobia:  no Effect on social functioning:  yes Numbers of missed days of school/work each month:  Confusion:  no Gait disturbance/ataxia:  no Behavioral changes:  no Fevers:  no  Relevant past medical, surgical, family and social history reviewed and updated as indicated. Interim medical history since our last visit reviewed. Allergies and medications reviewed and updated.  Review of Systems  Constitutional: Negative.   HENT: Negative.   Respiratory: Negative.   Cardiovascular: Negative.   Genitourinary: Negative.   Neurological: Positive for headaches. Negative for dizziness, tremors, seizures, syncope, facial asymmetry, speech difficulty, weakness, light-headedness and numbness.  Psychiatric/Behavioral: Negative.     Per HPI unless specifically indicated above     Objective:    BP 139/83   Pulse 82   Ht 5\' 10"  (1.778 m)   Wt 209 lb (94.8 kg)   SpO2 99%   BMI 29.99 kg/m   Wt Readings from Last 3 Encounters:  10/03/18 209 lb (94.8 kg)  09/13/17 210 lb (95.3 kg)  03/28/17 200 lb (90.7 kg)    Physical Exam Vitals signs and nursing note reviewed.  Constitutional:      General: She is not in acute distress.  Appearance: Normal appearance. She is not ill-appearing, toxic-appearing or diaphoretic.  HENT:     Head: Normocephalic and atraumatic.     Right Ear: External ear normal.     Left Ear: External ear normal.     Nose: Nose normal.     Mouth/Throat:     Mouth: Mucous membranes are moist.     Pharynx: Oropharynx is clear.  Eyes:     General: No scleral icterus.       Right eye: No discharge.        Left eye: No discharge.     Conjunctiva/sclera: Conjunctivae normal.     Pupils: Pupils are equal, round, and reactive to light.  Neck:     Musculoskeletal: Normal range of motion.  Pulmonary:     Effort: Pulmonary effort is normal. No respiratory distress.     Comments: Speaking in full sentences Musculoskeletal: Normal range of motion.  Skin:    Coloration: Skin is not jaundiced or pale.     Findings: No bruising, erythema, lesion or rash.  Neurological:     Mental Status: She is alert and oriented to person, place, and time. Mental status is at baseline.  Psychiatric:        Mood and Affect: Mood normal.        Behavior: Behavior normal.        Thought Content: Thought content normal.        Judgment: Judgment normal.  Results for orders placed or performed in visit on 09/13/17  CBC with Differential/Platelet  Result Value Ref Range   WBC 6.6 3.4 - 10.8 x10E3/uL   RBC 4.75 3.77 - 5.28 x10E6/uL   Hemoglobin 13.2 11.1 - 15.9 g/dL   Hematocrit 41.5 83.0 - 46.6 %   MCV 84 79 - 97 fL   MCH 27.8 26.6 - 33.0 pg   MCHC 33.2 31.5 - 35.7 g/dL   RDW 94.0 76.8 - 08.8 %   Platelets 268 150 - 379 x10E3/uL   Neutrophils 69 Not Estab. %   Lymphs 23 Not Estab. %   Monocytes 7 Not Estab. %   Eos 1 Not Estab. %   Basos 0 Not Estab. %   Neutrophils Absolute 4.5 1.4 - 7.0 x10E3/uL   Lymphocytes Absolute 1.5 0.7 - 3.1 x10E3/uL   Monocytes Absolute 0.5 0.1 - 0.9 x10E3/uL   EOS (ABSOLUTE) 0.1 0.0 - 0.4 x10E3/uL   Basophils Absolute 0.0 0.0 - 0.2 x10E3/uL   Immature Granulocytes 0 Not  Estab. %   Immature Grans (Abs) 0.0 0.0 - 0.1 x10E3/uL  Comprehensive metabolic panel  Result Value Ref Range   Glucose 79 65 - 99 mg/dL   BUN 11 8 - 27 mg/dL   Creatinine, Ser 1.10 0.57 - 1.00 mg/dL   GFR calc non Af Amer 95 >59 mL/min/1.73   GFR calc Af Amer 110 >59 mL/min/1.73   BUN/Creatinine Ratio 16 12 - 28   Sodium 144 134 - 144 mmol/L   Potassium 4.3 3.5 - 5.2 mmol/L   Chloride 104 96 - 106 mmol/L   CO2 24 20 - 29 mmol/L   Calcium 9.5 8.7 - 10.3 mg/dL   Total Protein 6.7 6.0 - 8.5 g/dL   Albumin 4.4 3.6 - 4.8 g/dL   Globulin, Total 2.3 1.5 - 4.5 g/dL   Albumin/Globulin Ratio 1.9 1.2 - 2.2   Bilirubin Total <0.2 0.0 - 1.2 mg/dL   Alkaline Phosphatase 106 39 - 117 IU/L   AST 9 0 - 40 IU/L   ALT 10 0 - 32 IU/L  Lipid Panel w/o Chol/HDL Ratio  Result Value Ref Range   Cholesterol, Total 221 (H) 100 - 199 mg/dL   Triglycerides 93 0 - 149 mg/dL   HDL 52 >31 mg/dL   VLDL Cholesterol Cal 19 5 - 40 mg/dL   LDL Calculated 594 (H) 0 - 99 mg/dL  TSH  Result Value Ref Range   TSH 1.030 0.450 - 4.500 uIU/mL  UA/M w/rflx Culture, Routine  Result Value Ref Range   Specific Gravity, UA 1.015 1.005 - 1.030   pH, UA 6.0 5.0 - 7.5   Color, UA Yellow Yellow   Appearance Ur Cloudy (A) Clear   Leukocytes, UA Negative Negative   Protein, UA Negative Negative/Trace   Glucose, UA Negative Negative   Ketones, UA Negative Negative   RBC, UA Negative Negative   Bilirubin, UA Negative Negative   Urobilinogen, Ur 1.0 0.2 - 1.0 mg/dL   Nitrite, UA Negative Negative      Assessment & Plan:   Problem List Items Addressed This Visit      Nervous and Auditory   Cluster headaches - Primary    Out of her medicine. Will restart it and recheck 1 month at physical. Call with any concerns. Continue to monitor.       Relevant Medications   SUMAtriptan (IMITREX) 6 MG/0.5ML SOLN injection   verapamil (CALAN) 80 MG tablet  Follow up plan: Return in about 4 weeks (around 10/31/2018) for  Physical/Follow up headaches .   Marland Kitchen This visit was completed via [employer] due to the restrictions of the COVID-19 pandemic. All issues as above were discussed and addressed. Physical exam was done as above through visual confirmation on [employer]. If it was felt that the patient should be evaluated in the office, they were directed there. The patient verbally consented to this visit. . Location of the patient: home . Location of the provider: work . Those involved with this call:  . Provider: Olevia Perches, DO . CMA: Elton Sin, CMA . Front Desk/Registration: Adela Ports  . Time spent on call: 15 minutes with patient face to face via video conference. More than 50% of this time was spent in counseling and coordination of care. 23 minutes total spent in review of patient's record and preparation of their chart.

## 2018-10-03 NOTE — Assessment & Plan Note (Signed)
Out of her medicine. Will restart it and recheck 1 month at physical. Call with any concerns. Continue to monitor.

## 2018-11-13 ENCOUNTER — Encounter: Payer: Self-pay | Admitting: Family Medicine

## 2018-11-13 ENCOUNTER — Ambulatory Visit (INDEPENDENT_AMBULATORY_CARE_PROVIDER_SITE_OTHER): Payer: Self-pay | Admitting: Family Medicine

## 2018-11-13 ENCOUNTER — Other Ambulatory Visit: Payer: Self-pay

## 2018-11-13 VITALS — BP 125/82 | HR 83 | Temp 98.1°F | Ht 68.0 in | Wt 205.0 lb

## 2018-11-13 DIAGNOSIS — Z0000 Encounter for general adult medical examination without abnormal findings: Secondary | ICD-10-CM

## 2018-11-13 MED ORDER — VERAPAMIL HCL 80 MG PO TABS: 80.0000 mg | ORAL_TABLET | Freq: Three times a day (TID) | ORAL | 3 refills | Status: DC

## 2018-11-13 NOTE — Progress Notes (Signed)
BP 125/82   Pulse 83   Temp 98.1 F (36.7 C) (Oral)   Ht 5\' 8"  (1.727 m)   Wt 205 lb (93 kg)   SpO2 98%   BMI 31.17 kg/m    Subjective:    Patient ID: Rhonda Blankenship, female    DOB: 11-Jan-1956, 63 y.o.   MRN: [id number]  HPI: Rhonda Blankenship is a 63 y.o. female presenting on 11/13/2018 for comprehensive medical examination. Current medical complaints include: Headaches gone again. Feeling well.   Menopausal Symptoms: no  Depression Screen done today and results listed below:  Depression screen [facility] 2/9 11/13/2018 10/03/2018 09/13/2017 02/17/2016  Decreased Interest 0 1 0 1  Down, Depressed, Hopeless 0 2 0 1  PHQ - 2 Score 0 3 0 2  Altered sleeping 0 3 0 -  Tired, decreased energy 0 2 0 -  Change in appetite 0 2 0 -  Feeling bad or failure about yourself  0 2 0 -  Trouble concentrating 0 3 0 -  Moving slowly or fidgety/restless 0 2 0 -  Suicidal thoughts 0 2 0 -  PHQ-9 Score 0 19 0 -  Difficult doing work/chores Not difficult at all Somewhat difficult Not difficult at all -    Past Medical History:  Past Medical History:  Diagnosis Date  . Chronic headache   . Cluster headaches     Surgical History:  Past Surgical History:  Procedure Laterality Date  . ABDOMINAL HYSTERECTOMY    . APPENDECTOMY    . NOSE SURGERY    . TONSILLECTOMY      Medications:  Current Outpatient Medications on File Prior to Visit  Medication Sig  . SUMAtriptan (IMITREX) 6 MG/0.5ML SOLN injection 0.35mL SQ at start of migraine May repeat in 2 hours if headache persists or recurs. Do not take more than 2 shots in 24 hours.   No current facility-administered medications on file prior to visit.     Allergies:  Allergies  Allergen Reactions  . Augmentin [Amoxicillin-Pot Clavulanate] Nausea And Vomiting  . Codeine Nausea And Vomiting    Social History:  Social History   Socioeconomic History  . Marital status: Single    Spouse name: Not on file  . Number of children: Not on file  . Years of  education: Not on file  . Highest education level: Not on file  Occupational History  . Not on file  Social Needs  . Financial resource strain: Not on file  . Food insecurity    Worry: Not on file    Inability: Not on file  . Transportation needs    Medical: Not on file    Non-medical: Not on file  Tobacco Use  . Smoking status: Current Every Day Smoker    Packs/day: 0.50    Types: Cigarettes  . Smokeless tobacco: Never Used  Substance and Sexual Activity  . Alcohol use: Yes    Alcohol/week: 0.0 standard drinks    Comment: Wine on occasion  . Drug use: No  . Sexual activity: Not on file  Lifestyle  . Physical activity    Days per week: Not on file    Minutes per session: Not on file  . Stress: Not on file  Relationships  . Social Herbalist on phone: Not on file    Gets together: Not on file    Attends religious service: Not on file    Active member of club or organization: Not on file  Attends meetings of clubs or organizations: Not on file    Relationship status: Not on file  . Intimate partner violence    Fear of current or ex partner: Not on file    Emotionally abused: Not on file    Physically abused: Not on file    Forced sexual activity: Not on file  Other Topics Concern  . Not on file  Social History Narrative  . Not on file   Social History   Tobacco Use  Smoking Status Current Every Day Smoker  . Packs/day: 0.50  . Types: Cigarettes  Smokeless Tobacco Never Used   Social History   Substance and Sexual Activity  Alcohol Use Yes  . Alcohol/week: 0.0 standard drinks   Comment: Wine on occasion    Family History:  Family History  Problem Relation Age of Onset  . Heart disease Mother   . Arthritis Sister   . Heart disease Brother   . Migraines Son   . Cancer Sister        Tumor  . Muscular dystrophy Brother   . Alzheimer's disease Maternal Grandmother   . Hypertension Maternal Grandmother   . Stroke Maternal Grandfather   .  Heart attack Maternal Grandfather   . Hypertension Paternal Grandfather     Past medical history, surgical history, medications, allergies, family history and social history reviewed with patient today and changes made to appropriate areas of the chart.   Review of Systems  Constitutional: Negative.   HENT: Negative.   Eyes: Negative.   Respiratory: Negative.   Cardiovascular: Negative.   Gastrointestinal: Positive for heartburn. Negative for abdominal pain, blood in stool, constipation, diarrhea, melena, nausea and vomiting.  Genitourinary: Negative.   Musculoskeletal: Positive for falls and myalgias. Negative for back pain, joint pain and neck pain.  Skin: Negative.   Neurological: Negative.   Endo/Heme/Allergies: Negative.   Psychiatric/Behavioral: Negative.     All other ROS negative except what is listed above and in the HPI.      Objective:    BP 125/82   Pulse 83   Temp 98.1 F (36.7 C) (Oral)   Ht 5\' 8"  (1.727 m)   Wt 205 lb (93 kg)   SpO2 98%   BMI 31.17 kg/m   Wt Readings from Last 3 Encounters:  11/13/18 205 lb (93 kg)  10/03/18 209 lb (94.8 kg)  09/13/17 210 lb (95.3 kg)    Physical Exam Vitals signs and nursing note reviewed.  Constitutional:      General: She is not in acute distress.    Appearance: Normal appearance. She is not ill-appearing, toxic-appearing or diaphoretic.  HENT:     Head: Normocephalic and atraumatic.     Right Ear: Tympanic membrane, ear canal and external ear normal. There is no impacted cerumen.     Left Ear: Tympanic membrane, ear canal and external ear normal. There is no impacted cerumen.     Nose: Nose normal. No congestion or rhinorrhea.     Mouth/Throat:     Mouth: Mucous membranes are moist.     Pharynx: Oropharynx is clear. No oropharyngeal exudate or posterior oropharyngeal erythema.  Eyes:     General: No scleral icterus.       Right eye: No discharge.        Left eye: No discharge.     Extraocular Movements:  Extraocular movements intact.     Conjunctiva/sclera: Conjunctivae normal.     Pupils: Pupils are equal, round, and reactive to light.  Neck:     Musculoskeletal: Normal range of motion and neck supple. No neck rigidity or muscular tenderness.     Vascular: No carotid bruit.  Cardiovascular:     Rate and Rhythm: Normal rate and regular rhythm.     Pulses: Normal pulses.     Heart sounds: No murmur. No friction rub. No gallop.   Pulmonary:     Effort: Pulmonary effort is normal. No respiratory distress.     Breath sounds: Normal breath sounds. No stridor. No wheezing, rhonchi or rales.  Chest:     Chest wall: No tenderness.  Abdominal:     General: Abdomen is flat. Bowel sounds are normal. There is no distension.     Palpations: Abdomen is soft. There is no mass.     Tenderness: There is no abdominal tenderness. There is no right CVA tenderness, left CVA tenderness, guarding or rebound.     Hernia: No hernia is present.  Genitourinary:    Comments: Breast and pelvic exams deferred with shared decision making Musculoskeletal:        General: No swelling, tenderness, deformity or signs of injury.     Right lower leg: No edema.     Left lower leg: No edema.  Lymphadenopathy:     Cervical: No cervical adenopathy.  Skin:    General: Skin is warm and dry.     Capillary Refill: Capillary refill takes less than 2 seconds.     Coloration: Skin is not jaundiced or pale.     Findings: No bruising, erythema, lesion or rash.  Neurological:     General: No focal deficit present.     Mental Status: She is alert and oriented to person, place, and time. Mental status is at baseline.     Cranial Nerves: No cranial nerve deficit.     Sensory: No sensory deficit.     Motor: No weakness.     Coordination: Coordination normal.     Gait: Gait normal.     Deep Tendon Reflexes: Reflexes normal.  Psychiatric:        Mood and Affect: Mood normal.        Behavior: Behavior normal.        Thought  Content: Thought content normal.        Judgment: Judgment normal.     Results for orders placed or performed in visit on 09/13/17  CBC with Differential/Platelet  Result Value Ref Range   WBC 6.6 3.4 - 10.8 x10E3/uL   RBC 4.75 3.77 - 5.28 x10E6/uL   Hemoglobin 13.2 11.1 - 15.9 g/dL   Hematocrit 16.139.8 09.634.0 - 46.6 %   MCV 84 79 - 97 fL   MCH 27.8 26.6 - 33.0 pg   MCHC 33.2 31.5 - 35.7 g/dL   RDW 04.514.3 40.912.3 - 81.115.4 %   Platelets 268 150 - 379 x10E3/uL   Neutrophils 69 Not Estab. %   Lymphs 23 Not Estab. %   Monocytes 7 Not Estab. %   Eos 1 Not Estab. %   Basos 0 Not Estab. %   Neutrophils Absolute 4.5 1.4 - 7.0 x10E3/uL   Lymphocytes Absolute 1.5 0.7 - 3.1 x10E3/uL   Monocytes Absolute 0.5 0.1 - 0.9 x10E3/uL   EOS (ABSOLUTE) 0.1 0.0 - 0.4 x10E3/uL   Basophils Absolute 0.0 0.0 - 0.2 x10E3/uL   Immature Granulocytes 0 Not Estab. %   Immature Grans (Abs) 0.0 0.0 - 0.1 x10E3/uL  Comprehensive metabolic panel  Result Value Ref Range  Glucose 79 65 - 99 mg/dL   BUN 11 8 - 27 mg/dL   Creatinine, Ser 4.03 0.57 - 1.00 mg/dL   GFR calc non Af Amer 95 >59 mL/min/1.73   GFR calc Af Amer 110 >59 mL/min/1.73   BUN/Creatinine Ratio 16 12 - 28   Sodium 144 134 - 144 mmol/L   Potassium 4.3 3.5 - 5.2 mmol/L   Chloride 104 96 - 106 mmol/L   CO2 24 20 - 29 mmol/L   Calcium 9.5 8.7 - 10.3 mg/dL   Total Protein 6.7 6.0 - 8.5 g/dL   Albumin 4.4 3.6 - 4.8 g/dL   Globulin, Total 2.3 1.5 - 4.5 g/dL   Albumin/Globulin Ratio 1.9 1.2 - 2.2   Bilirubin Total <0.2 0.0 - 1.2 mg/dL   Alkaline Phosphatase 106 39 - 117 IU/L   AST 9 0 - 40 IU/L   ALT 10 0 - 32 IU/L  Lipid Panel w/o Chol/HDL Ratio  Result Value Ref Range   Cholesterol, Total 221 (H) 100 - 199 mg/dL   Triglycerides 93 0 - 149 mg/dL   HDL 52 >47 mg/dL   VLDL Cholesterol Cal 19 5 - 40 mg/dL   LDL Calculated 425 (H) 0 - 99 mg/dL  TSH  Result Value Ref Range   TSH 1.030 0.450 - 4.500 uIU/mL  UA/M w/rflx Culture, Routine   Specimen:  Urine   URINE  Result Value Ref Range   Specific Gravity, UA 1.015 1.005 - 1.030   pH, UA 6.0 5.0 - 7.5   Color, UA Yellow Yellow   Appearance Ur Cloudy (A) Clear   Leukocytes, UA Negative Negative   Protein, UA Negative Negative/Trace   Glucose, UA Negative Negative   Ketones, UA Negative Negative   RBC, UA Negative Negative   Bilirubin, UA Negative Negative   Urobilinogen, Ur 1.0 0.2 - 1.0 mg/dL   Nitrite, UA Negative Negative      Assessment & Plan:   Problem List Items Addressed This Visit    None    Visit Diagnoses    Routine general medical examination at a health care facility    -  Primary   Vaccines declined. Screening labs checked today. Pap N/A. Mammogram and colon with medicare. Continue diet and exercise. Call with any concerns.       Follow up plan: Return in about 1 year (around 11/13/2019) for Physical.   LABORATORY TESTING:  - Pap smear: not applicable  IMMUNIZATIONS:   - Tdap: Tetanus vaccination status reviewed: last tetanus booster within 10 years. - Influenza: Postponed to flu season  SCREENING: -Mammogram: Refused  - Colonoscopy: Refused   PATIENT COUNSELING:   Advised to take 1 mg of folate supplement per day if capable of pregnancy.   Sexuality: Discussed sexually transmitted diseases, partner selection, use of condoms, avoidance of unintended pregnancy  and contraceptive alternatives.   Advised to avoid cigarette smoking.  I discussed with the patient that most people either abstain from alcohol or drink within safe limits (<=14/week and <=4 drinks/occasion for males, <=7/weeks and <= 3 drinks/occasion for females) and that the risk for alcohol disorders and other health effects rises proportionally with the number of drinks per week and how often a drinker exceeds daily limits.  Discussed cessation/primary prevention of drug use and availability of treatment for abuse.   Diet: Encouraged to adjust caloric intake to maintain  or achieve  ideal body weight, to reduce intake of dietary saturated fat and total fat, to limit sodium  intake by avoiding high sodium foods and not adding table salt, and to maintain adequate dietary potassium and calcium preferably from fresh fruits, vegetables, and low-fat dairy products.    stressed the importance of regular exercise  Injury prevention: Discussed safety belts, safety helmets, smoke detector, smoking near bedding or upholstery.   Dental health: Discussed importance of regular tooth brushing, flossing, and dental visits.    NEXT PREVENTATIVE PHYSICAL DUE IN 1 YEAR. Return in about 1 year (around 11/13/2019) for Physical.

## 2018-11-13 NOTE — Patient Instructions (Signed)
Health Maintenance, Female Adopting a healthy lifestyle and getting preventive care are important in promoting health and wellness. Ask your health care provider about:  The right schedule for you to have regular tests and exams.  Things you can do on your own to prevent diseases and keep yourself healthy. What should I know about diet, weight, and exercise? Eat a healthy diet   Eat a diet that includes plenty of vegetables, fruits, low-fat dairy products, and lean protein.  Do not eat a lot of foods that are high in solid fats, added sugars, or sodium. Maintain a healthy weight Body mass index (BMI) is used to identify weight problems. It estimates body fat based on height and weight. Your health care provider can help determine your BMI and help you achieve or maintain a healthy weight. Get regular exercise Get regular exercise. This is one of the most important things you can do for your health. Most adults should:  Exercise for at least 150 minutes each week. The exercise should increase your heart rate and make you sweat (moderate-intensity exercise).  Do strengthening exercises at least twice a week. This is in addition to the moderate-intensity exercise.  Spend less time sitting. Even light physical activity can be beneficial. Watch cholesterol and blood lipids Have your blood tested for lipids and cholesterol at 61 years of age, then have this test every 5 years. Have your cholesterol levels checked more often if:  Your lipid or cholesterol levels are high.  You are older than 61 years of age.  You are at high risk for heart disease. What should I know about cancer screening? Depending on your health history and family history, you may need to have cancer screening at various ages. This may include screening for:  Breast cancer.  Cervical cancer.  Colorectal cancer.  Skin cancer.  Lung cancer. What should I know about heart disease, diabetes, and high blood  pressure? Blood pressure and heart disease  High blood pressure causes heart disease and increases the risk of stroke. This is more likely to develop in people who have high blood pressure readings, are of African descent, or are overweight.  Have your blood pressure checked: ? Every 3-5 years if you are 18-39 years of age. ? Every year if you are 40 years old or older. Diabetes Have regular diabetes screenings. This checks your fasting blood sugar level. Have the screening done:  Once every three years after age 40 if you are at a normal weight and have a low risk for diabetes.  More often and at a younger age if you are overweight or have a high risk for diabetes. What should I know about preventing infection? Hepatitis B If you have a higher risk for hepatitis B, you should be screened for this virus. Talk with your health care provider to find out if you are at risk for hepatitis B infection. Hepatitis C Testing is recommended for:  Everyone born from 1945 through 1965.  Anyone with known risk factors for hepatitis C. Sexually transmitted infections (STIs)  Get screened for STIs, including gonorrhea and chlamydia, if: ? You are sexually active and are younger than 61 years of age. ? You are older than 61 years of age and your health care provider tells you that you are at risk for this type of infection. ? Your sexual activity has changed since you were last screened, and you are at increased risk for chlamydia or gonorrhea. Ask your health care provider if   you are at risk.  Ask your health care provider about whether you are at high risk for HIV. Your health care provider may recommend a prescription medicine to help prevent HIV infection. If you choose to take medicine to prevent HIV, you should first get tested for HIV. You should then be tested every 3 months for as long as you are taking the medicine. Pregnancy  If you are about to stop having your period (premenopausal) and  you may become pregnant, seek counseling before you get pregnant.  Take 400 to 800 micrograms (mcg) of folic acid every day if you become pregnant.  Ask for birth control (contraception) if you want to prevent pregnancy. Osteoporosis and menopause Osteoporosis is a disease in which the bones lose minerals and strength with aging. This can result in bone fractures. If you are 65 years old or older, or if you are at risk for osteoporosis and fractures, ask your health care provider if you should:  Be screened for bone loss.  Take a calcium or vitamin D supplement to lower your risk of fractures.  Be given hormone replacement therapy (HRT) to treat symptoms of menopause. Follow these instructions at home: Lifestyle  Do not use any products that contain nicotine or tobacco, such as cigarettes, e-cigarettes, and chewing tobacco. If you need help quitting, ask your health care provider.  Do not use street drugs.  Do not share needles.  Ask your health care provider for help if you need support or information about quitting drugs. Alcohol use  Do not drink alcohol if: ? Your health care provider tells you not to drink. ? You are pregnant, may be pregnant, or are planning to become pregnant.  If you drink alcohol: ? Limit how much you use to 0-1 drink a day. ? Limit intake if you are breastfeeding.  Be aware of how much alcohol is in your drink. In the [place] one drink equals one 12 oz bottle of beer (355 mL), one 5 oz glass of wine (148 mL), or one 1 oz glass of hard liquor (44 mL). General instructions  Schedule regular health, dental, and eye exams.  Stay current with your vaccines.  Tell your health care provider if: ? You often feel depressed. ? You have ever been abused or do not feel safe at home. Summary  Adopting a healthy lifestyle and getting preventive care are important in promoting health and wellness.  Follow your health care provider's instructions about healthy  diet, exercising, and getting tested or screened for diseases.  Follow your health care provider's instructions on monitoring your cholesterol and blood pressure. This information is not intended to replace advice given to you by your health care provider. Make sure you discuss any questions you have with your health care provider. Document Released: 11/16/2010 Document Revised: 04/26/2018 Document Reviewed: 04/26/2018 Elsevier Patient Education  2020 [employer]  

## 2019-11-15 ENCOUNTER — Ambulatory Visit (INDEPENDENT_AMBULATORY_CARE_PROVIDER_SITE_OTHER): Payer: Self-pay | Admitting: Family Medicine

## 2019-11-15 ENCOUNTER — Other Ambulatory Visit: Payer: Self-pay

## 2019-11-15 ENCOUNTER — Encounter: Payer: Self-pay | Admitting: Family Medicine

## 2019-11-15 VITALS — BP 133/82 | HR 75 | Temp 98.6°F | Ht 67.72 in | Wt 193.4 lb

## 2019-11-15 DIAGNOSIS — Z0000 Encounter for general adult medical examination without abnormal findings: Secondary | ICD-10-CM

## 2019-11-15 DIAGNOSIS — G44029 Chronic cluster headache, not intractable: Secondary | ICD-10-CM

## 2019-11-15 DIAGNOSIS — B839 Helminthiasis, unspecified: Secondary | ICD-10-CM

## 2019-11-15 MED ORDER — VERAPAMIL HCL 80 MG PO TABS: 80.0000 mg | ORAL_TABLET | Freq: Three times a day (TID) | ORAL | 3 refills | Status: DC

## 2019-11-15 MED ORDER — SUMATRIPTAN SUCCINATE 6 MG/0.5ML ~~LOC~~ SOLN: SUBCUTANEOUS | 12 refills | Status: DC

## 2019-11-15 NOTE — Patient Instructions (Addendum)
Drop the stool study off at [facility]:  [address] [place], [place] [address] Phone: [phone]  Health Maintenance for Postmenopausal Women Menopause is a normal process in which your ability to get pregnant comes to an end. This process happens slowly over many months or years, usually between the ages of 63 and 25. Menopause is complete when you have missed your menstrual periods for 12 months. It is important to talk with your health care provider about some of the most common conditions that affect women after menopause (postmenopausal women). These include heart disease, cancer, and bone loss (osteoporosis). Adopting a healthy lifestyle and getting preventive care can help to promote your health and wellness. The actions you take can also lower your chances of developing some of these common conditions. What should I know about menopause? During menopause, you may get a number of symptoms, such as:  Hot flashes. These can be moderate or severe.  Night sweats.  Decrease in sex drive.  Mood swings.  Headaches.  Tiredness.  Irritability.  Memory problems.  Insomnia. Choosing to treat or not to treat these symptoms is a decision that you make with your health care provider. Do I need hormone replacement therapy?  Hormone replacement therapy is effective in treating symptoms that are caused by menopause, such as hot flashes and night sweats.  Hormone replacement carries certain risks, especially as you become older. If you are thinking about using estrogen or estrogen with progestin, discuss the benefits and risks with your health care provider. What is my risk for heart disease and stroke? The risk of heart disease, heart attack, and stroke increases as you age. One of the causes may be a change in the body's hormones during menopause. This can affect how your body uses dietary fats, triglycerides, and cholesterol. Heart attack and stroke are medical emergencies. There  are many things that you can do to help prevent heart disease and stroke. Watch your blood pressure  High blood pressure causes heart disease and increases the risk of stroke. This is more likely to develop in people who have high blood pressure readings, are of African descent, or are overweight.  Have your blood pressure checked: ? Every 3-5 years if you are 25-33 years of age. ? Every year if you are 63 years old or older. Eat a healthy diet   Eat a diet that includes plenty of vegetables, fruits, low-fat dairy products, and lean protein.  Do not eat a lot of foods that are high in solid fats, added sugars, or sodium. Get regular exercise Get regular exercise. This is one of the most important things you can do for your health. Most adults should:  Try to exercise for at least 150 minutes each week. The exercise should increase your heart rate and make you sweat (moderate-intensity exercise).  Try to do strengthening exercises at least twice each week. Do these in addition to the moderate-intensity exercise.  Spend less time sitting. Even light physical activity can be beneficial. Other tips  Work with your health care provider to achieve or maintain a healthy weight.  Do not use any products that contain nicotine or tobacco, such as cigarettes, e-cigarettes, and chewing tobacco. If you need help quitting, ask your health care provider.  Know your numbers. Ask your health care provider to check your cholesterol and your blood sugar (glucose). Continue to have your blood tested as directed by your health care provider. Do I need screening for cancer? Depending  on your health history and family history, you may need to have cancer screening at different stages of your life. This may include screening for:  Breast cancer.  Cervical cancer.  Lung cancer.  Colorectal cancer. What is my risk for osteoporosis? After menopause, you may be at increased risk for osteoporosis.  Osteoporosis is a condition in which bone destruction happens more quickly than new bone creation. To help prevent osteoporosis or the bone fractures that can happen because of osteoporosis, you may take the following actions:  If you are 16-71 years old, get at least 1,000 mg of calcium and at least 600 mg of vitamin D per day.  If you are older than age 68 but younger than age 59, get at least 1,200 mg of calcium and at least 600 mg of vitamin D per day.  If you are older than age 11, get at least 1,200 mg of calcium and at least 800 mg of vitamin D per day. Smoking and drinking excessive alcohol increase the risk of osteoporosis. Eat foods that are rich in calcium and vitamin D, and do weight-bearing exercises several times each week as directed by your health care provider. How does menopause affect my mental health? Depression may occur at any age, but it is more common as you become older. Common symptoms of depression include:  Low or sad mood.  Changes in sleep patterns.  Changes in appetite or eating patterns.  Feeling an overall lack of motivation or enjoyment of activities that you previously enjoyed.  Frequent crying spells. Talk with your health care provider if you think that you are experiencing depression. General instructions See your health care provider for regular wellness exams and vaccines. This may include:  Scheduling regular health, dental, and eye exams.  Getting and maintaining your vaccines. These include: ? Influenza vaccine. Get this vaccine each year before the flu season begins. ? Pneumonia vaccine. ? Shingles vaccine. ? Tetanus, diphtheria, and pertussis (Tdap) booster vaccine. Your health care provider may also recommend other immunizations. Tell your health care provider if you have ever been abused or do not feel safe at home. Summary  Menopause is a normal process in which your ability to get pregnant comes to an end.  This condition causes  hot flashes, night sweats, decreased interest in sex, mood swings, headaches, or lack of sleep.  Treatment for this condition may include hormone replacement therapy.  Take actions to keep yourself healthy, including exercising regularly, eating a healthy diet, watching your weight, and checking your blood pressure and blood sugar levels.  Get screened for cancer and depression. Make sure that you are up to date with all your vaccines. This information is not intended to replace advice given to you by your health care provider. Make sure you discuss any questions you have with your health care provider. Document Revised: 04/26/2018 Document Reviewed: 04/26/2018 Elsevier Patient Education  2020 [employer].  Tapeworm Infection Tapeworms are parasites that can live in your intestines. When eggs from a tapeworm are consumed, the eggs develop into a young tapeworm (larva) and eventually an adult tapeworm inside of the body. An adult tapeworm can grow very long and can live inside of a human for many years. Tapeworm infection is rare in the [place]. There are several types of tapeworms. Most tapeworm infections cause only mild symptoms and are limited to the intestines. One type of tapeworm, called a pork tapeworm (Taenia solium or T. solium), can cause a more serious infection (  cysticercosis). In this type of infection, tapeworm larvae can spread through the body and form cysts in areas such as the eyes, skin, muscle tissue, heart, and brain. What are the causes? This condition is caused by:  Eating beef, pork, or fish that is raw or has not been cooked well enough.  Drinking water that is contaminated with tapeworm eggs.  Eating food that is contaminated with tapeworm eggs. What increases the risk? You are more likely to develop this condition if:  You live in or travel to places where livestock roam freely, such as rural, developing countries.  You work with animals or are exposed to  animals.  You live in a household with someone who has a tapeworm infection.  You eat food that has been prepared by someone with a tapeworm infection. What are the signs or symptoms? In some cases, there are no symptoms. If you have symptoms, they may include:  Pain in the abdomen.  Nausea.  Loss of appetite.  Weight loss.  Worm segments in your stool (feces).  Diarrhea. Symptoms of cysticercosis vary depending on where the cysts form in your body. Symptoms may include:  Feeling lumps under your skin.  Headache.  Seizure.  Confusion.  Loss of vision.  Muscle weakness.  Balance problems.  Eye pain. How is this diagnosed? This condition may be diagnosed based on:  Your symptoms.  Your travel history.  Your diet history.  Blood tests.  Stool tests.  Tests on the fluid that surrounds the brain and spinal cord (cerebrospinal fluid, CSF). The fluid is removed with a needle during a spinal tap (lumbar puncture).  Imaging tests, such as a CT scan or MRI. How is this treated? Treatment for this condition depends on the type of tapeworm and your symptoms. Most often, this condition is treated with antiparasitic medicine to kill the tapeworms. Treatment for cysticercosis depends on the number of cysts and the symptoms you have. Treatment may include:  Medicines to: ? Kill tapeworm larvae or eggs. ? Decrease swelling (steroids). ? Prevent seizures (anti-epileptics).  Surgery to remove cysts. Follow these instructions at home:   Take over-the-counter and prescription medicines only as told by your health care provider.  If you were prescribed an antiparasitic medicine, take it as told by your health care provider. Do not stop taking the antiparasitic even if you start to feel better.  Keep all follow-up visits as told by your health care provider. This is important.  Wash your hands often with soap and water. If soap and water are not available, use hand  sanitizer. How is this prevented?  Do not eat raw or undercooked fish or meat. Cook fish and meat according to food safety guidelines. Use a meat thermometer to make sure that fish and meat are cooked to the recommended temperatures.  Wash your hands with soap and water: ? After you use the toilet. ? Before you handle or prepare food. ? After you handle or prepare food.  Freeze meat for 12 hours before cooking it to help prevent tapeworm infection. Only eat raw fish (such as sushi) that has been previously frozen.  Before you eat raw fruits and vegetables: ? Wash them in boiled, bottled, or treated water. ? Peel them.  When traveling in developing countries: ? Make sure you only drink water that is bottled or treated. ? Do not eat or drink anything that may be contaminated, including beverages with ice cubes that may have been made from unboiled or untreated  water. ? Do not eat raw foods that have been washed in unboiled tap water. ? It is safe to drink bottled or canned beverages, such as carbonated beverages, teas, pasteurized fruit drinks, or steaming hot beverages. Contact a health care provider if you:  Still have symptoms of tapeworm infection after your treatment is complete.  Develop any new symptoms. Get help right away if you:  Have a seizure.  Have sudden vision loss.  Feel light-headed or you faint.  Become confused. Summary  Tapeworms are parasites that can live in your intestines. Tapeworms can live in a human body for many years.  Tapeworms develop from tapeworm eggs that are consumed. This can occur from eating beef, pork, or fish that is raw or has not been cooked well enough, or from eating food or drinking water that is contaminated with tapeworm eggs.  Most tapeworm infections cause only mild symptoms and are limited to the intestines. One type of tapeworm can cause a more serious infection (cysticercosis) where the larvae spread through the body and form  cysts.  Most often, this condition is treated with antiparasitic medicine to kill the tapeworms. This information is not intended to replace advice given to you by your health care provider. Make sure you discuss any questions you have with your health care provider. Document Revised: 08/24/2018 Document Reviewed: 06/10/2017 Elsevier Patient Education  2020 [employer]

## 2019-11-15 NOTE — Progress Notes (Signed)
BP 133/82 (BP Location: Left Arm, Patient Position: Sitting, Cuff Size: Normal)   Pulse 75   Temp 98.6 F (37 C) (Oral)   Ht 5' 7.72" (1.72 m)   Wt 193 lb 6.4 oz (87.7 kg)   SpO2 98%   BMI 29.65 kg/m    Subjective:    Patient ID: Rhonda Blankenship, female    DOB: Mar 28, 1956, 64 y.o.   MRN: [id number]  HPI: Rhonda Blankenship is a 64 y.o. female presenting on 11/15/2019 for comprehensive medical examination. Current medical complaints include:  Head is doing well. Feeling well on her medicine. No side effects. Hasn't had any headaches in a while.   Menopausal Symptoms: no  Depression Screen done today and results listed below:  Depression screen [facility] 2/9 11/15/2019 11/13/2018 10/03/2018 09/13/2017 02/17/2016  Decreased Interest 0 0 1 0 1  Down, Depressed, Hopeless 0 0 2 0 1  PHQ - 2 Score 0 0 3 0 2  Altered sleeping 0 0 3 0 -  Tired, decreased energy 0 0 2 0 -  Change in appetite 0 0 2 0 -  Feeling bad or failure about yourself  0 0 2 0 -  Trouble concentrating 0 0 3 0 -  Moving slowly or fidgety/restless 0 0 2 0 -  Suicidal thoughts 0 0 2 0 -  PHQ-9 Score 0 0 19 0 -  Difficult doing work/chores Not difficult at all Not difficult at all Somewhat difficult Not difficult at all -     Past Medical History:  Past Medical History:  Diagnosis Date  . Chronic headache   . Cluster headaches     Surgical History:  Past Surgical History:  Procedure Laterality Date  . ABDOMINAL HYSTERECTOMY    . APPENDECTOMY    . NOSE SURGERY    . TONSILLECTOMY      Medications:  No current outpatient medications on file prior to visit.   No current facility-administered medications on file prior to visit.    Allergies:  Allergies  Allergen Reactions  . Augmentin [Amoxicillin-Pot Clavulanate] Nausea And Vomiting  . Codeine Nausea And Vomiting    Social History:  Social History   Socioeconomic History  . Marital status: Single    Spouse name: Not on file  . Number of children: Not on file  .  Years of education: Not on file  . Highest education level: Not on file  Occupational History  . Not on file  Tobacco Use  . Smoking status: Former Smoker    Packs/day: 0.50    Types: Cigarettes  . Smokeless tobacco: Never Used  Vaping Use  . Vaping Use: Every day  . Start date: 10/04/2019  . Substances: Nicotine  Substance and Sexual Activity  . Alcohol use: Yes    Alcohol/week: 0.0 standard drinks    Comment: Wine on occasion  . Drug use: No  . Sexual activity: Not Currently  Other Topics Concern  . Not on file  Social History Narrative  . Not on file   Social Determinants of Health   Financial Resource Strain:   . Difficulty of Paying Living Expenses:   Food Insecurity:   . Worried About Programme researcher, broadcasting/film/video in the Last Year:   . Barista in the Last Year:   Transportation Needs:   . Freight forwarder (Medical):   Marland Kitchen Lack of Transportation (Non-Medical):   Physical Activity:   . Days of Exercise per Week:   . Minutes of Exercise  per Session:   Stress:   . Feeling of Stress :   Social Connections:   . Frequency of Communication with Friends and Family:   . Frequency of Social Gatherings with Friends and Family:   . Attends Religious Services:   . Active Member of Clubs or Organizations:   . Attends Banker Meetings:   Marland Kitchen Marital Status:   Intimate Partner Violence:   . Fear of Current or Ex-Partner:   . Emotionally Abused:   Marland Kitchen Physically Abused:   . Sexually Abused:    Social History   Tobacco Use  Smoking Status Former Smoker  . Packs/day: 0.50  . Types: Cigarettes  Smokeless Tobacco Never Used   Social History   Substance and Sexual Activity  Alcohol Use Yes  . Alcohol/week: 0.0 standard drinks   Comment: Wine on occasion    Family History:  Family History  Problem Relation Age of Onset  . Heart disease Mother   . Arthritis Sister   . Heart disease Brother   . Migraines Son   . Cancer Sister        Tumor  . Muscular  dystrophy Brother   . Alzheimer's disease Maternal Grandmother   . Hypertension Maternal Grandmother   . Stroke Maternal Grandfather   . Heart attack Maternal Grandfather   . Hypertension Paternal Grandfather     Past medical history, surgical history, medications, allergies, family history and social history reviewed with patient today and changes made to appropriate areas of the chart.   Review of Systems  Constitutional: Positive for malaise/fatigue and weight loss. Negative for chills, diaphoresis and fever.  HENT: Negative.   Eyes: Positive for blurred vision. Negative for double vision, photophobia, pain, discharge and redness.  Respiratory: Negative.   Cardiovascular: Positive for palpitations. Negative for chest pain, orthopnea, claudication, leg swelling and PND.  Gastrointestinal: Positive for blood in stool (maybe- reddish brown), constipation and diarrhea. Negative for abdominal pain, heartburn, melena, nausea and vomiting.       Notes that she passed a tape worm about a year ago and another one a couple of days ago. Has lost weight without trying and notes that her belly has been swollen.   Genitourinary: Negative.   Musculoskeletal: Positive for myalgias. Negative for back pain, falls, joint pain and neck pain.  Skin: Negative.   Neurological: Negative.   Endo/Heme/Allergies: Negative.   Psychiatric/Behavioral: Negative.     All other ROS negative except what is listed above and in the HPI.      Objective:    BP 133/82 (BP Location: Left Arm, Patient Position: Sitting, Cuff Size: Normal)   Pulse 75   Temp 98.6 F (37 C) (Oral)   Ht 5' 7.72" (1.72 m)   Wt 193 lb 6.4 oz (87.7 kg)   SpO2 98%   BMI 29.65 kg/m   Wt Readings from Last 3 Encounters:  11/15/19 193 lb 6.4 oz (87.7 kg)  11/13/18 205 lb (93 kg)  10/03/18 209 lb (94.8 kg)    Physical Exam Vitals and nursing note reviewed.  Constitutional:      General: She is not in acute distress.    Appearance:  Normal appearance. She is not ill-appearing, toxic-appearing or diaphoretic.  HENT:     Head: Normocephalic and atraumatic.     Right Ear: Tympanic membrane, ear canal and external ear normal. There is no impacted cerumen.     Left Ear: Tympanic membrane, ear canal and external ear normal. There is no  impacted cerumen.     Nose: Nose normal. No congestion or rhinorrhea.     Mouth/Throat:     Mouth: Mucous membranes are moist.     Pharynx: Oropharynx is clear. No oropharyngeal exudate or posterior oropharyngeal erythema.  Eyes:     General: No scleral icterus.       Right eye: No discharge.        Left eye: No discharge.     Extraocular Movements: Extraocular movements intact.     Conjunctiva/sclera: Conjunctivae normal.     Pupils: Pupils are equal, round, and reactive to light.  Neck:     Vascular: No carotid bruit.  Cardiovascular:     Rate and Rhythm: Normal rate and regular rhythm.     Pulses: Normal pulses.     Heart sounds: No murmur heard.  No friction rub. No gallop.   Pulmonary:     Effort: Pulmonary effort is normal. No respiratory distress.     Breath sounds: Normal breath sounds. No stridor. No wheezing, rhonchi or rales.  Chest:     Chest wall: No tenderness.  Abdominal:     General: Abdomen is flat. Bowel sounds are normal. There is distension.     Palpations: Abdomen is soft. There is no mass.     Tenderness: There is no abdominal tenderness. There is no right CVA tenderness, left CVA tenderness, guarding or rebound.     Hernia: No hernia is present.  Genitourinary:    Comments: Breast and pelvic exams deferred with shared decision making Musculoskeletal:        General: No swelling, tenderness, deformity or signs of injury.     Cervical back: Normal range of motion and neck supple. No rigidity. No muscular tenderness.     Right lower leg: No edema.     Left lower leg: No edema.  Lymphadenopathy:     Cervical: No cervical adenopathy.  Skin:    General: Skin  is warm and dry.     Capillary Refill: Capillary refill takes less than 2 seconds.     Coloration: Skin is not jaundiced or pale.     Findings: No bruising, erythema, lesion or rash.  Neurological:     General: No focal deficit present.     Mental Status: She is alert and oriented to person, place, and time. Mental status is at baseline.     Cranial Nerves: No cranial nerve deficit.     Sensory: No sensory deficit.     Motor: No weakness.     Coordination: Coordination normal.     Gait: Gait normal.     Deep Tendon Reflexes: Reflexes normal.  Psychiatric:        Mood and Affect: Mood normal.        Behavior: Behavior normal.        Thought Content: Thought content normal.        Judgment: Judgment normal.     Results for orders placed or performed in visit on 09/13/17  CBC with Differential/Platelet  Result Value Ref Range   WBC 6.6 3.4 - 10.8 x10E3/uL   RBC 4.75 3.77 - 5.28 x10E6/uL   Hemoglobin 13.2 11.1 - 15.9 g/dL   Hematocrit 40.0 86.7 - 46.6 %   MCV 84 79 - 97 fL   MCH 27.8 26.6 - 33.0 pg   MCHC 33.2 31 - 35 g/dL   RDW 61.9 50.9 - 32.6 %   Platelets 268 150 - 379 x10E3/uL   Neutrophils 69 Not  Estab. %   Lymphs 23 Not Estab. %   Monocytes 7 Not Estab. %   Eos 1 Not Estab. %   Basos 0 Not Estab. %   Neutrophils Absolute 4.5 1 - 7 x10E3/uL   Lymphocytes Absolute 1.5 0 - 3 x10E3/uL   Monocytes Absolute 0.5 0 - 0 x10E3/uL   EOS (ABSOLUTE) 0.1 0.0 - 0.4 x10E3/uL   Basophils Absolute 0.0 0 - 0 x10E3/uL   Immature Granulocytes 0 Not Estab. %   Immature Grans (Abs) 0.0 0.0 - 0.1 x10E3/uL  Comprehensive metabolic panel  Result Value Ref Range   Glucose 79 65 - 99 mg/dL   BUN 11 8 - 27 mg/dL   Creatinine, Ser 8.290.68 0.57 - 1.00 mg/dL   GFR calc non Af Amer 95 >59 mL/min/1.73   GFR calc Af Amer 110 >59 mL/min/1.73   BUN/Creatinine Ratio 16 12 - 28   Sodium 144 134 - 144 mmol/L   Potassium 4.3 3.5 - 5.2 mmol/L   Chloride 104 96 - 106 mmol/L   CO2 24 20 - 29 mmol/L    Calcium 9.5 8.7 - 10.3 mg/dL   Total Protein 6.7 6.0 - 8.5 g/dL   Albumin 4.4 3.6 - 4.8 g/dL   Globulin, Total 2.3 1.5 - 4.5 g/dL   Albumin/Globulin Ratio 1.9 1.2 - 2.2   Bilirubin Total <0.2 0.0 - 1.2 mg/dL   Alkaline Phosphatase 106 39 - 117 IU/L   AST 9 0 - 40 IU/L   ALT 10 0 - 32 IU/L  Lipid Panel w/o Chol/HDL Ratio  Result Value Ref Range   Cholesterol, Total 221 (H) 100 - 199 mg/dL   Triglycerides 93 0 - 149 mg/dL   HDL 52 >56>39 mg/dL   VLDL Cholesterol Cal 19 5 - 40 mg/dL   LDL Calculated 213150 (H) 0 - 99 mg/dL  TSH  Result Value Ref Range   TSH 1.030 0.450 - 4.500 uIU/mL  UA/M w/rflx Culture, Routine   Specimen: Urine   URINE  Result Value Ref Range   Specific Gravity, UA 1.015 1.005 - 1.030   pH, UA 6.0 5.0 - 7.5   Color, UA Yellow Yellow   Appearance Ur Cloudy (A) Clear   Leukocytes, UA Negative Negative   Protein, UA Negative Negative/Trace   Glucose, UA Negative Negative   Ketones, UA Negative Negative   RBC, UA Negative Negative   Bilirubin, UA Negative Negative   Urobilinogen, Ur 1.0 0.2 - 1.0 mg/dL   Nitrite, UA Negative Negative      Assessment & Plan:   Problem List Items Addressed This Visit      Nervous and Auditory   Cluster headaches    Under good control on current regimen. Continue current regimen. Continue to monitor. Call with any concerns. Refills given. Labs drawn today.       Relevant Medications   SUMAtriptan (IMITREX) 6 MG/0.5ML SOLN injection   verapamil (CALAN) 80 MG tablet    Other Visit Diagnoses    Routine general medical examination at a health care facility    -  Primary   Vaccines declined. Screeing labs checked today. Pap N/A. Will do mammo and colonoscopy with medicare. Continue diet and exercise. Call with any concerns.    Relevant Orders   CBC with Differential/Platelet   Comprehensive metabolic panel   Lipid Panel w/o Chol/HDL Ratio   Worms in stool       Concern for tapeworm with blood, worms, weight loss and abdominal  distention.  Check Ova and parasites and treat as needed.    Relevant Orders   Ova and parasite examination       Follow up plan: Return in about 1 year (around 11/14/2020) for physical.   LABORATORY TESTING:  - Pap smear: not applicable  IMMUNIZATIONS:   - Tdap: Tetanus vaccination status reviewed: last tetanus booster within 10 years. - Influenza: Postponed to flu season - Pneumovax: Refused - COVID: Refused  SCREENING: -Mammogram: Refused  - Colonoscopy: Refused   PATIENT COUNSELING:   Advised to take 1 mg of folate supplement per day if capable of pregnancy.   Sexuality: Discussed sexually transmitted diseases, partner selection, use of condoms, avoidance of unintended pregnancy  and contraceptive alternatives.   Advised to avoid cigarette smoking.  I discussed with the patient that most people either abstain from alcohol or drink within safe limits (<=14/week and <=4 drinks/occasion for males, <=7/weeks and <= 3 drinks/occasion for females) and that the risk for alcohol disorders and other health effects rises proportionally with the number of drinks per week and how often a drinker exceeds daily limits.  Discussed cessation/primary prevention of drug use and availability of treatment for abuse.   Diet: Encouraged to adjust caloric intake to maintain  or achieve ideal body weight, to reduce intake of dietary saturated fat and total fat, to limit sodium intake by avoiding high sodium foods and not adding table salt, and to maintain adequate dietary potassium and calcium preferably from fresh fruits, vegetables, and low-fat dairy products.    stressed the importance of regular exercise  Injury prevention: Discussed safety belts, safety helmets, smoke detector, smoking near bedding or upholstery.   Dental health: Discussed importance of regular tooth brushing, flossing, and dental visits.    NEXT PREVENTATIVE PHYSICAL DUE IN 1 YEAR. Return in about 1 year (around 11/14/2020)  for physical.

## 2019-11-15 NOTE — Assessment & Plan Note (Signed)
Under good control on current regimen. Continue current regimen. Continue to monitor. Call with any concerns. Refills given. Labs drawn today.   

## 2019-11-16 LAB — CBC WITH DIFFERENTIAL/PLATELET
Basophils Absolute: 0 10*3/uL (ref 0.0–0.2)
Basos: 1 %
EOS (ABSOLUTE): 0.1 10*3/uL (ref 0.0–0.4)
Eos: 1 %
Hematocrit: 35.9 % (ref 34.0–46.6)
Hemoglobin: 12.7 g/dL (ref 11.1–15.9)
Immature Grans (Abs): 0 10*3/uL (ref 0.0–0.1)
Immature Granulocytes: 0 %
Lymphocytes Absolute: 2 10*3/uL (ref 0.7–3.1)
Lymphs: 32 %
MCH: 29.9 pg (ref 26.6–33.0)
MCHC: 35.4 g/dL (ref 31.5–35.7)
MCV: 85 fL (ref 79–97)
Monocytes Absolute: 0.5 10*3/uL (ref 0.1–0.9)
Monocytes: 8 %
Neutrophils Absolute: 3.6 10*3/uL (ref 1.4–7.0)
Neutrophils: 58 %
Platelets: 246 10*3/uL (ref 150–450)
RBC: 4.25 x10E6/uL (ref 3.77–5.28)
RDW: 12.7 % (ref 11.7–15.4)
WBC: 6.2 10*3/uL (ref 3.4–10.8)

## 2019-11-16 LAB — COMPREHENSIVE METABOLIC PANEL
ALT: 9 IU/L (ref 0–32)
AST: 11 IU/L (ref 0–40)
Albumin/Globulin Ratio: 2 (ref 1.2–2.2)
Albumin: 4.3 g/dL (ref 3.8–4.8)
Alkaline Phosphatase: 85 IU/L (ref 48–121)
BUN/Creatinine Ratio: 20 (ref 12–28)
BUN: 14 mg/dL (ref 8–27)
Bilirubin Total: 0.2 mg/dL (ref 0.0–1.2)
CO2: 23 mmol/L (ref 20–29)
Calcium: 9.5 mg/dL (ref 8.7–10.3)
Chloride: 104 mmol/L (ref 96–106)
Creatinine, Ser: 0.71 mg/dL (ref 0.57–1.00)
GFR calc Af Amer: 106 mL/min/{1.73_m2} (ref >59)
GFR calc non Af Amer: 92 mL/min/{1.73_m2} (ref >59)
Globulin, Total: 2.1 g/dL (ref 1.5–4.5)
Glucose: 93 mg/dL (ref 65–99)
Potassium: 4.3 mmol/L (ref 3.5–5.2)
Sodium: 141 mmol/L (ref 134–144)
Total Protein: 6.4 g/dL (ref 6.0–8.5)

## 2019-11-16 LAB — LIPID PANEL W/O CHOL/HDL RATIO
Cholesterol, Total: 227 mg/dL — ABNORMAL HIGH (ref 100–199)
HDL: 63 mg/dL (ref >39)
LDL Chol Calc (NIH): 148 mg/dL — ABNORMAL HIGH (ref 0–99)
Triglycerides: 90 mg/dL (ref 0–149)
VLDL Cholesterol Cal: 16 mg/dL (ref 5–40)

## 2019-11-28 LAB — OVA AND PARASITE EXAMINATION

## 2020-04-28 ENCOUNTER — Encounter: Payer: Self-pay | Admitting: Family Medicine

## 2020-05-06 ENCOUNTER — Telehealth: Payer: Self-pay

## 2020-05-06 NOTE — Telephone Encounter (Signed)
Is this ok?

## 2020-05-06 NOTE — Telephone Encounter (Signed)
No. It is to be used PRN

## 2020-05-06 NOTE — Telephone Encounter (Signed)
Copied from CRM [id number]. Topic: Quick Communication - Rx Refill/Question >> May 06, 2020  2:27 PM Randol Kern wrote: Dahlia Client from [facility], called reporting that they need more instructions for pt's sumatriptan. The pt wants to take this everyday twice a day. The pharmacy needs this specified in the instructions if PCP approves of everyday usage.  Reason for CRM:

## 2020-05-06 NOTE — Telephone Encounter (Signed)
Called pharmacy and discussed with the pharmacist, Dahlia Client. Dahlia Client states she discussed this with the patient yesterday afternoon that she cannot take this medication everyday. Dahlia Client wants to know if we can send in a more detailed RX with directions maybe stating how many injections the patient can do per week or per month. She states that the patient got about 5 boxes within the last week or 2 from them. States that the patient has been paying for these out of pocket as she is past the point of insurance coverage.

## 2020-05-09 MED ORDER — SUMATRIPTAN SUCCINATE 6 MG/0.5ML ~~LOC~~ SOLN: SUBCUTANEOUS | 0 refills | Status: AC

## 2020-06-24 ENCOUNTER — Other Ambulatory Visit: Payer: Self-pay

## 2020-06-24 ENCOUNTER — Encounter: Payer: Self-pay | Admitting: Family Medicine

## 2020-06-24 ENCOUNTER — Ambulatory Visit (INDEPENDENT_AMBULATORY_CARE_PROVIDER_SITE_OTHER): Payer: BLUE CROSS/BLUE SHIELD | Admitting: Family Medicine

## 2020-06-24 VITALS — BP 114/71 | HR 84 | Temp 98.0°F | Wt 206.0 lb

## 2020-06-24 DIAGNOSIS — R9431 Abnormal electrocardiogram [ECG] [EKG]: Secondary | ICD-10-CM | POA: Diagnosis not present

## 2020-06-24 DIAGNOSIS — R5382 Chronic fatigue, unspecified: Secondary | ICD-10-CM | POA: Diagnosis not present

## 2020-06-24 DIAGNOSIS — Z13220 Encounter for screening for lipoid disorders: Secondary | ICD-10-CM

## 2020-06-24 DIAGNOSIS — R079 Chest pain, unspecified: Secondary | ICD-10-CM | POA: Diagnosis not present

## 2020-06-24 LAB — BAYER DCA HB A1C WAIVED: HB A1C (BAYER DCA - WAIVED): 5.5 % (ref <7.0)

## 2020-06-24 NOTE — Patient Instructions (Signed)

## 2020-06-24 NOTE — Progress Notes (Signed)
BP 114/71   Pulse 84   Temp 98 F (36.7 C)   Wt 206 lb (93.4 kg)   SpO2 96%   BMI 31.58 kg/m    Subjective:    Patient ID: Rhonda Blankenship, female    DOB: 02/05/1956, 65 y.o.   MRN: [id number]  HPI: Rhonda Blankenship is a 65 y.o. female  Chief Complaint  Patient presents with  . Hospitalization Follow-up    Patient went to hospital on 06/11/20 for covid because she was dehydrated. Before covid pt was very tired and fatigue all the time. Patient was told she had abnormal EKG and labs. Patient has been having some fluid retention.    ER FOLLOW UP Time since discharge: 13 days Hospital/facility: [facility] Diagnosis: Abnormal EKG, COVID Procedures/tests: EKG, labs Consultants:  None New medications: zofran Discharge instructions:  Left without being seen after triage Status: fluctuating  Since getting out of the ER she has been incredibly fatigued. She has been having swelling in her legs. Has varicose  CHEST PAIN Time since onset: off and on for about 2 years, significantly worse in the last 6-7 months Duration:months Onset: sudden Quality: aching pain Severity: severe Location: between her shoulder blades and in her sterum and into her ja2 Radiation: back Episode duration: minutes  Frequency: intermittent- almost daily Related to exertion: yes Activity when pain started:  Trauma: no Anxiety/recent stressors: no Status: fluctuating Treatments attempted: nothing  Current pain status: in pain Shortness of breath: yes Cough: yes Nausea: yes Diaphoresis: yes Heartburn: yes Palpitations: yes   Relevant past medical, surgical, family and social history reviewed and updated as indicated. Interim medical history since our last visit reviewed. Allergies and medications reviewed and updated.  Review of Systems  Constitutional: Positive for fatigue. Negative for activity change, appetite change, chills, diaphoresis, fever and unexpected weight  change.  HENT: Negative.   Respiratory: Positive for shortness of breath. Negative for apnea, cough, choking, chest tightness, wheezing and stridor.   Cardiovascular: Positive for chest pain (pressure, tightness), palpitations and leg swelling.  Gastrointestinal: Negative.   Musculoskeletal: Positive for back pain. Negative for arthralgias, gait problem, joint swelling, myalgias, neck pain and neck stiffness.  Psychiatric/Behavioral: Negative.     Per HPI unless specifically indicated above     Objective:    BP 114/71   Pulse 84   Temp 98 F (36.7 C)   Wt 206 lb (93.4 kg)   SpO2 96%   BMI 31.58 kg/m   Wt Readings from Last 3 Encounters:  06/24/20 206 lb (93.4 kg)  11/15/19 193 lb 6.4 oz (87.7 kg)  11/13/18 205 lb (93 kg)    Physical Exam Vitals and nursing note reviewed.  Constitutional:      General: She is not in acute distress.    Appearance: Normal appearance. She is normal weight. She is not ill-appearing, toxic-appearing or diaphoretic.  HENT:     Head: Normocephalic and atraumatic.     Right Ear: External ear normal.     Left Ear: External ear normal.     Nose: Nose normal.     Mouth/Throat:     Mouth: Mucous membranes are moist.     Pharynx: Oropharynx is clear.  Eyes:     General: No scleral icterus.       Right eye: No discharge.        Left eye: No discharge.     Extraocular Movements: Extraocular movements intact.     Conjunctiva/sclera: Conjunctivae normal.  Pupils: Pupils are equal, round, and reactive to light.  Cardiovascular:     Rate and Rhythm: Normal rate and regular rhythm.     Pulses: Normal pulses.     Heart sounds: Normal heart sounds. No murmur heard. No friction rub. No gallop.   Pulmonary:     Effort: Pulmonary effort is normal. No respiratory distress.     Breath sounds: Normal breath sounds. No stridor. No wheezing, rhonchi or rales.  Chest:     Chest wall: No tenderness.  Musculoskeletal:        General: Normal range of  motion.     Cervical back: Normal range of motion and neck supple.     Comments: Varicose veins bilaterally  Skin:    General: Skin is warm and dry.     Capillary Refill: Capillary refill takes less than 2 seconds.     Coloration: Skin is not jaundiced or pale.     Findings: No bruising, erythema, lesion or rash.  Neurological:     General: No focal deficit present.     Mental Status: She is alert and oriented to person, place, and time. Mental status is at baseline.  Psychiatric:        Mood and Affect: Mood normal.        Behavior: Behavior normal.        Thought Content: Thought content normal.        Judgment: Judgment normal.     Results for orders placed or performed in visit on 11/15/19  Ova and parasite examination   Specimen: Stool   ST  Result Value Ref Range   OVA + PARASITE EXAM Final report    O&P result 1 Comment   CBC with Differential/Platelet  Result Value Ref Range   WBC 6.2 3.4 - 10.8 x10E3/uL   RBC 4.25 3.77 - 5.28 x10E6/uL   Hemoglobin 12.7 11.1 - 15.9 g/dL   Hematocrit 50.3 54.6 - 46.6 %   MCV 85 79 - 97 fL   MCH 29.9 26.6 - 33.0 pg   MCHC 35.4 31.5 - 35.7 g/dL   RDW 56.8 12.7 - 51.7 %   Platelets 246 150 - 450 x10E3/uL   Neutrophils 58 Not Estab. %   Lymphs 32 Not Estab. %   Monocytes 8 Not Estab. %   Eos 1 Not Estab. %   Basos 1 Not Estab. %   Neutrophils Absolute 3.6 1.4 - 7.0 x10E3/uL   Lymphocytes Absolute 2.0 0.7 - 3.1 x10E3/uL   Monocytes Absolute 0.5 0.1 - 0.9 x10E3/uL   EOS (ABSOLUTE) 0.1 0.0 - 0.4 x10E3/uL   Basophils Absolute 0.0 0.0 - 0.2 x10E3/uL   Immature Granulocytes 0 Not Estab. %   Immature Grans (Abs) 0.0 0.0 - 0.1 x10E3/uL  Comprehensive metabolic panel  Result Value Ref Range   Glucose 93 65 - 99 mg/dL   BUN 14 8 - 27 mg/dL   Creatinine, Ser 0.01 0.57 - 1.00 mg/dL   GFR calc non Af Amer 92 >59 mL/min/1.73   GFR calc Af Amer 106 >59 mL/min/1.73   BUN/Creatinine Ratio 20 12 - 28   Sodium 141 134 - 144 mmol/L    Potassium 4.3 3.5 - 5.2 mmol/L   Chloride 104 96 - 106 mmol/L   CO2 23 20 - 29 mmol/L   Calcium 9.5 8.7 - 10.3 mg/dL   Total Protein 6.4 6.0 - 8.5 g/dL   Albumin 4.3 3.8 - 4.8 g/dL   Globulin, Total 2.1 1.5 -  4.5 g/dL   Albumin/Globulin Ratio 2.0 1.2 - 2.2   Bilirubin Total 0.2 0.0 - 1.2 mg/dL   Alkaline Phosphatase 85 48 - 121 IU/L   AST 11 0 - 40 IU/L   ALT 9 0 - 32 IU/L  Lipid Panel w/o Chol/HDL Ratio  Result Value Ref Range   Cholesterol, Total 227 (H) 100 - 199 mg/dL   Triglycerides 90 0 - 149 mg/dL   HDL 63 >86 mg/dL   VLDL Cholesterol Cal 16 5 - 40 mg/dL   LDL Chol Calc (NIH) 761 (H) 0 - 99 mg/dL      Assessment & Plan:   Problem List Items Addressed This Visit   None   Visit Diagnoses    Chest pain, unspecified type    -  Primary   Flipped t-waves with fatigue and CPx 6 months. Will get her into cardiology ASAP. Checking lipids today. BP under good control.    Relevant Orders   Ambulatory referral to Cardiology   Abnormal EKG       Flipped t-waves with fatigue and CPx 6 months. Will get her into cardiology ASAP. Checking lipids today. BP under good control.    Relevant Orders   EKG 12-Lead (Completed)   Ambulatory referral to Cardiology   Screening for cholesterol level       Labs drawn today. Await results.    Relevant Orders   Lipid Panel w/o Chol/HDL Ratio   Chronic fatigue       Checking labs today. Concern for heart disease. Await results.    Relevant Orders   Bayer DCA Hb A1c Waived   CBC with Differential/Platelet   Comprehensive metabolic panel   TSH   VITAMIN D 25 Hydroxy (Vit-D Deficiency, Fractures)       Follow up plan: Return as scheduled.

## 2020-06-25 ENCOUNTER — Encounter: Payer: Self-pay | Admitting: Family Medicine

## 2020-06-25 LAB — COMPREHENSIVE METABOLIC PANEL
ALT: 12 IU/L (ref 0–32)
AST: 14 IU/L (ref 0–40)
Albumin/Globulin Ratio: 2.1 (ref 1.2–2.2)
Albumin: 4.2 g/dL (ref 3.8–4.8)
Alkaline Phosphatase: 87 IU/L (ref 44–121)
BUN/Creatinine Ratio: 26 (ref 12–28)
BUN: 18 mg/dL (ref 8–27)
Bilirubin Total: 0.3 mg/dL (ref 0.0–1.2)
CO2: 22 mmol/L (ref 20–29)
Calcium: 9.1 mg/dL (ref 8.7–10.3)
Chloride: 103 mmol/L (ref 96–106)
Creatinine, Ser: 0.68 mg/dL (ref 0.57–1.00)
GFR calc Af Amer: 108 mL/min/{1.73_m2} (ref >59)
GFR calc non Af Amer: 94 mL/min/{1.73_m2} (ref >59)
Globulin, Total: 2 g/dL (ref 1.5–4.5)
Glucose: 85 mg/dL (ref 65–99)
Potassium: 4 mmol/L (ref 3.5–5.2)
Sodium: 140 mmol/L (ref 134–144)
Total Protein: 6.2 g/dL (ref 6.0–8.5)

## 2020-06-25 LAB — CBC WITH DIFFERENTIAL/PLATELET
Basophils Absolute: 0 10*3/uL (ref 0.0–0.2)
Basos: 0 %
EOS (ABSOLUTE): 0.1 10*3/uL (ref 0.0–0.4)
Eos: 1 %
Hematocrit: 33.7 % — ABNORMAL LOW (ref 34.0–46.6)
Hemoglobin: 11.6 g/dL (ref 11.1–15.9)
Immature Grans (Abs): 0 10*3/uL (ref 0.0–0.1)
Immature Granulocytes: 0 %
Lymphocytes Absolute: 1.6 10*3/uL (ref 0.7–3.1)
Lymphs: 18 %
MCH: 28.2 pg (ref 26.6–33.0)
MCHC: 34.4 g/dL (ref 31.5–35.7)
MCV: 82 fL (ref 79–97)
Monocytes Absolute: 0.5 10*3/uL (ref 0.1–0.9)
Monocytes: 5 %
Neutrophils Absolute: 6.8 10*3/uL (ref 1.4–7.0)
Neutrophils: 76 %
Platelets: 388 10*3/uL (ref 150–450)
RBC: 4.11 x10E6/uL (ref 3.77–5.28)
RDW: 12.9 % (ref 11.7–15.4)
WBC: 9 10*3/uL (ref 3.4–10.8)

## 2020-06-25 LAB — TSH: TSH: 0.61 u[IU]/mL (ref 0.450–4.500)

## 2020-06-25 LAB — LIPID PANEL W/O CHOL/HDL RATIO
Cholesterol, Total: 202 mg/dL — ABNORMAL HIGH (ref 100–199)
HDL: 56 mg/dL (ref >39)
LDL Chol Calc (NIH): 129 mg/dL — ABNORMAL HIGH (ref 0–99)
Triglycerides: 96 mg/dL (ref 0–149)
VLDL Cholesterol Cal: 17 mg/dL (ref 5–40)

## 2020-06-25 LAB — VITAMIN D 25 HYDROXY (VIT D DEFICIENCY, FRACTURES): Vit D, 25-Hydroxy: 19.8 ng/mL — ABNORMAL LOW (ref 30.0–100.0)

## 2020-06-26 DIAGNOSIS — F17200 Nicotine dependence, unspecified, uncomplicated: Secondary | ICD-10-CM | POA: Diagnosis not present

## 2020-06-26 DIAGNOSIS — I10 Essential (primary) hypertension: Secondary | ICD-10-CM | POA: Diagnosis not present

## 2020-06-26 DIAGNOSIS — R9431 Abnormal electrocardiogram [ECG] [EKG]: Secondary | ICD-10-CM | POA: Diagnosis not present

## 2020-06-26 DIAGNOSIS — R0789 Other chest pain: Secondary | ICD-10-CM | POA: Diagnosis not present

## 2020-06-28 NOTE — Progress Notes (Signed)
Interpreted by me on 06/24/20. NSR at 72bpm with flipped t-waves in the anterior leads

## 2020-11-21 ENCOUNTER — Ambulatory Visit (INDEPENDENT_AMBULATORY_CARE_PROVIDER_SITE_OTHER): Payer: BLUE CROSS/BLUE SHIELD | Admitting: Family Medicine

## 2020-11-21 ENCOUNTER — Other Ambulatory Visit: Payer: Self-pay

## 2020-11-21 ENCOUNTER — Encounter: Payer: Self-pay | Admitting: Family Medicine

## 2020-11-21 VITALS — BP 109/74 | HR 76 | Temp 98.2°F | Ht 69.0 in | Wt 208.0 lb

## 2020-11-21 DIAGNOSIS — Z720 Tobacco use: Secondary | ICD-10-CM | POA: Diagnosis not present

## 2020-11-21 DIAGNOSIS — Z0000 Encounter for general adult medical examination without abnormal findings: Secondary | ICD-10-CM

## 2020-11-21 DIAGNOSIS — G44029 Chronic cluster headache, not intractable: Secondary | ICD-10-CM

## 2020-11-21 LAB — URINALYSIS, ROUTINE W REFLEX MICROSCOPIC
Bilirubin, UA: NEGATIVE
Glucose, UA: NEGATIVE
Ketones, UA: NEGATIVE
Leukocytes,UA: NEGATIVE
Nitrite, UA: NEGATIVE
Protein,UA: NEGATIVE
RBC, UA: NEGATIVE
Specific Gravity, UA: 1.015 (ref 1.005–1.030)
Urobilinogen, Ur: 0.2 mg/dL (ref 0.2–1.0)
pH, UA: 7 (ref 5.0–7.5)

## 2020-11-21 MED ORDER — VARENICLINE TARTRATE 0.5 MG X 11 & 1 MG X 42 PO MISC: ORAL | 0 refills | Status: AC

## 2020-11-21 MED ORDER — VERAPAMIL HCL 80 MG PO TABS: 80.0000 mg | ORAL_TABLET | Freq: Three times a day (TID) | ORAL | 3 refills | Status: AC

## 2020-11-21 NOTE — Assessment & Plan Note (Signed)
Under good control on current regimen. Continue current regimen. Continue to monitor. Call with any concerns. Refills given.   

## 2020-11-21 NOTE — Progress Notes (Signed)
BP 109/74   Pulse 76   Temp 98.2 F (36.8 C)   Ht 5\' 9"  (1.753 m)   Wt 208 lb (94.3 kg)   SpO2 98%   BMI 30.72 kg/m    Subjective:    Patient ID: , female    DOB: 1956/04/02, 65 y.o.   MRN: [id number]  HPI: Rhonda Blankenship is a 65 y.o. female presenting on 11/21/2020 for comprehensive medical examination. Current medical complaints include:  FATIGUE Duration:  chronic Severity: moderate  Onset: gradual Context when symptoms started:  had covid right before Symptoms improve with rest: no  Depressive symptoms: no Stress/anxiety: no Insomnia: yes  Snoring: yes Observed apnea by bed partner: no Daytime hypersomnolence:yes Wakes feeling refreshed: no History of sleep study: no Dysnea on exertion:  no Orthopnea/PND: no Chest pain: no Chronic cough: yes Lower extremity edema: mild Arthralgias:no Myalgias: yes Weakness: no Rash: no  SMOKING CESSATION Smoking Status: current every day smoker Smoking Amount: less than a pack a day Smoking Onset: 3 months Smoking Quit Date: not set Smoking triggers: tress Type of tobacco use: cigarettes Children in the house: no Other household members who smoke: no Treatments attempted: chantix Pneumovax: up to date  Menopausal Symptoms: no  Depression Screen done today and results listed below:  Depression screen [facility] 2/9 11/21/2020 11/15/2019 11/13/2018 10/03/2018 09/13/2017  Decreased Interest 0 0 0 1 0  Down, Depressed, Hopeless 0 0 0 2 0  PHQ - 2 Score 0 0 0 3 0  Altered sleeping - 0 0 3 0  Tired, decreased energy - 0 0 2 0  Change in appetite - 0 0 2 0  Feeling bad or failure about yourself  - 0 0 2 0  Trouble concentrating - 0 0 3 0  Moving slowly or fidgety/restless - 0 0 2 0  Suicidal thoughts - 0 0 2 0  PHQ-9 Score - 0 0 19 0  Difficult doing work/chores - Not difficult at all Not difficult at all Somewhat difficult Not difficult at all    Past Medical History:  Past Medical History:  Diagnosis Date   Chronic  headache    Cluster headaches     Surgical History:  Past Surgical History:  Procedure Laterality Date   ABDOMINAL HYSTERECTOMY     APPENDECTOMY     NOSE SURGERY     TONSILLECTOMY      Medications:  Current Outpatient Medications on File Prior to Visit  Medication Sig   SUMAtriptan (IMITREX) 6 MG/0.5ML SOLN injection 0.20mL SQ at start of migraine May repeat in 2 hours if headache persists or recurs. Do not take more than 2 shots in 24 hours. Do not take more than 1-2x a week, if needing more often, call. Increased use can cause rebound headaches (Patient not taking: No sig reported)   No current facility-administered medications on file prior to visit.    Allergies:  Allergies  Allergen Reactions   Augmentin [Amoxicillin-Pot Clavulanate] Nausea And Vomiting   Codeine Nausea And Vomiting   Zofran [Ondansetron] Nausea And Vomiting    Social History:  Social History   Socioeconomic History   Marital status: Single    Spouse name: Not on file   Number of children: Not on file   Years of education: Not on file   Highest education level: Not on file  Occupational History   Not on file  Tobacco Use   Smoking status: Every Day    Packs/day: 0.50    Pack  years: 0.00    Types: Cigarettes   Smokeless tobacco: Never  Vaping Use   Vaping Use: Every day   Start date: 10/04/2019   Substances: Nicotine  Substance and Sexual Activity   Alcohol use: Yes    Alcohol/week: 0.0 standard drinks    Comment: Wine on occasion   Drug use: No   Sexual activity: Not Currently  Other Topics Concern   Not on file  Social History Narrative   Not on file   Social Determinants of Health   Financial Resource Strain: Not on file  Food Insecurity: Not on file  Transportation Needs: Not on file  Physical Activity: Not on file  Stress: Not on file  Social Connections: Not on file  Intimate Partner Violence: Not on file   Social History   Tobacco Use  Smoking Status Every Day    Packs/day: 0.50   Pack years: 0.00   Types: Cigarettes  Smokeless Tobacco Never   Social History   Substance and Sexual Activity  Alcohol Use Yes   Alcohol/week: 0.0 standard drinks   Comment: Wine on occasion    Family History:  Family History  Problem Relation Age of Onset   Heart disease Mother    Arthritis Sister    Heart disease Brother    Migraines Son    Cancer Sister        Tumor   Muscular dystrophy Brother    Alzheimer's disease Maternal Grandmother    Hypertension Maternal Grandmother    Stroke Maternal Grandfather    Heart attack Maternal Grandfather    Hypertension Paternal Grandfather     Past medical history, surgical history, medications, allergies, family history and social history reviewed with patient today and changes made to appropriate areas of the chart.   Review of Systems  Constitutional: Negative.   HENT: Negative.    Eyes: Negative.   Respiratory:  Positive for cough. Negative for hemoptysis, sputum production, shortness of breath and wheezing.   Cardiovascular: Negative.   Gastrointestinal:  Positive for diarrhea. Negative for abdominal pain, blood in stool, constipation, heartburn, melena, nausea and vomiting.  Genitourinary: Negative.   Musculoskeletal:  Positive for joint pain and myalgias. Negative for back pain, falls and neck pain.  Skin: Negative.   Neurological: Negative.   Endo/Heme/Allergies:  Positive for polydipsia. Negative for environmental allergies. Does not bruise/bleed easily.  Psychiatric/Behavioral: Negative.    All other ROS negative except what is listed above and in the HPI.      Objective:    BP 109/74   Pulse 76   Temp 98.2 F (36.8 C)   Ht 5\' 9"  (1.753 m)   Wt 208 lb (94.3 kg)   SpO2 98%   BMI 30.72 kg/m   Wt Readings from Last 3 Encounters:  11/21/20 208 lb (94.3 kg)  06/24/20 206 lb (93.4 kg)  11/15/19 193 lb 6.4 oz (87.7 kg)    Physical Exam Vitals and nursing note reviewed.  Constitutional:       General: She is not in acute distress.    Appearance: Normal appearance. She is not ill-appearing, toxic-appearing or diaphoretic.  HENT:     Head: Normocephalic and atraumatic.     Right Ear: Tympanic membrane, ear canal and external ear normal. There is no impacted cerumen.     Left Ear: Tympanic membrane, ear canal and external ear normal. There is no impacted cerumen.     Nose: Nose normal. No congestion or rhinorrhea.     Mouth/Throat:  Mouth: Mucous membranes are moist.     Pharynx: Oropharynx is clear. No oropharyngeal exudate or posterior oropharyngeal erythema.  Eyes:     General: No scleral icterus.       Right eye: No discharge.        Left eye: No discharge.     Extraocular Movements: Extraocular movements intact.     Conjunctiva/sclera: Conjunctivae normal.     Pupils: Pupils are equal, round, and reactive to light.  Neck:     Vascular: No carotid bruit.  Cardiovascular:     Rate and Rhythm: Normal rate and regular rhythm.     Pulses: Normal pulses.     Heart sounds: No murmur heard.   No friction rub. No gallop.  Pulmonary:     Effort: Pulmonary effort is normal. No respiratory distress.     Breath sounds: Normal breath sounds. No stridor. No wheezing, rhonchi or rales.  Chest:     Chest wall: No tenderness.  Abdominal:     General: Abdomen is flat. Bowel sounds are normal. There is no distension.     Palpations: Abdomen is soft. There is no mass.     Tenderness: There is no abdominal tenderness. There is no right CVA tenderness, left CVA tenderness, guarding or rebound.     Hernia: No hernia is present.  Genitourinary:    Comments: Breast and pelvic exams deferred with shared decision making Musculoskeletal:        General: No swelling, tenderness, deformity or signs of injury.     Cervical back: Normal range of motion and neck supple. No rigidity. No muscular tenderness.     Right lower leg: No edema.     Left lower leg: No edema.  Lymphadenopathy:      Cervical: No cervical adenopathy.  Skin:    General: Skin is warm and dry.     Capillary Refill: Capillary refill takes less than 2 seconds.     Coloration: Skin is not jaundiced or pale.     Findings: No bruising, erythema, lesion or rash.  Neurological:     General: No focal deficit present.     Mental Status: She is alert and oriented to person, place, and time. Mental status is at baseline.     Cranial Nerves: No cranial nerve deficit.     Sensory: No sensory deficit.     Motor: No weakness.     Coordination: Coordination normal.     Gait: Gait normal.     Deep Tendon Reflexes: Reflexes normal.  Psychiatric:        Mood and Affect: Mood normal.        Behavior: Behavior normal.        Thought Content: Thought content normal.        Judgment: Judgment normal.    Results for orders placed or performed in visit on 11/21/20  Urinalysis, Routine w reflex microscopic  Result Value Ref Range   Specific Gravity, UA 1.015 1.005 - 1.030   pH, UA 7.0 5.0 - 7.5   Color, UA Yellow Yellow   Appearance Ur Clear Clear   Leukocytes,UA Negative Negative   Protein,UA Negative Negative/Trace   Glucose, UA Negative Negative   Ketones, UA Negative Negative   RBC, UA Negative Negative   Bilirubin, UA Negative Negative   Urobilinogen, Ur 0.2 0.2 - 1.0 mg/dL   Nitrite, UA Negative Negative      Assessment & Plan:   Problem List Items Addressed This Visit  Nervous and Auditory   Cluster headaches    Under good control on current regimen. Continue current regimen. Continue to monitor. Call with any concerns. Refills given.         Relevant Medications   verapamil (CALAN) 80 MG tablet   Other Visit Diagnoses     Routine general medical examination at a health care facility    -  Primary   Vaccines declined. Screening labs checked today. Pap N/A. Mammo and colonscopy declined. Continue diet and exercise. Call with any concerns.    Relevant Orders   CBC with  Differential/Platelet   Comprehensive metabolic panel   Lipid Panel w/o Chol/HDL Ratio   Urinalysis, Routine w reflex microscopic (Completed)   TSH   HIV Antibody (routine testing w rflx)   Hepatitis C Antibody   Tobacco abuse       Will start chantix. Call with any concerns.         Follow up plan: Return in about 1 year (around 11/21/2021).   LABORATORY TESTING:  - Pap smear: not applicable  IMMUNIZATIONS:   - Tdap: Tetanus vaccination status reviewed: last tetanus booster within 10 years. - Influenza: Postponed to flu season - Pneumovax: Up to date - Prevnar: Not applicable - COVID: Refused - Shingrix vaccine: Refused  SCREENING: -Mammogram: Refused  - Colonoscopy: Refused  - Bone Density: Not applicable    PATIENT COUNSELING:   Advised to take 1 mg of folate supplement per day if capable of pregnancy.   Sexuality: Discussed sexually transmitted diseases, partner selection, use of condoms, avoidance of unintended pregnancy  and contraceptive alternatives.   Advised to avoid cigarette smoking.  I discussed with the patient that most people either abstain from alcohol or drink within safe limits (<=14/week and <=4 drinks/occasion for males, <=7/weeks and <= 3 drinks/occasion for females) and that the risk for alcohol disorders and other health effects rises proportionally with the number of drinks per week and how often a drinker exceeds daily limits.  Discussed cessation/primary prevention of drug use and availability of treatment for abuse.   Diet: Encouraged to adjust caloric intake to maintain  or achieve ideal body weight, to reduce intake of dietary saturated fat and total fat, to limit sodium intake by avoiding high sodium foods and not adding table salt, and to maintain adequate dietary potassium and calcium preferably from fresh fruits, vegetables, and low-fat dairy products.    stressed the importance of regular exercise  Injury prevention: Discussed safety  belts, safety helmets, smoke detector, smoking near bedding or upholstery.   Dental health: Discussed importance of regular tooth brushing, flossing, and dental visits.    NEXT PREVENTATIVE PHYSICAL DUE IN 1 YEAR. Return in about 1 year (around 11/21/2021).

## 2020-11-22 ENCOUNTER — Encounter: Payer: Self-pay | Admitting: Family Medicine

## 2020-11-22 LAB — COMPREHENSIVE METABOLIC PANEL
ALT: 18 IU/L (ref 0–32)
AST: 19 IU/L (ref 0–40)
Albumin/Globulin Ratio: 2.1 (ref 1.2–2.2)
Albumin: 4.6 g/dL (ref 3.8–4.8)
Alkaline Phosphatase: 89 IU/L (ref 44–121)
BUN/Creatinine Ratio: 19 (ref 12–28)
BUN: 10 mg/dL (ref 8–27)
Bilirubin Total: 0.3 mg/dL (ref 0.0–1.2)
CO2: 21 mmol/L (ref 20–29)
Calcium: 9.6 mg/dL (ref 8.7–10.3)
Chloride: 105 mmol/L (ref 96–106)
Creatinine, Ser: 0.54 mg/dL — ABNORMAL LOW (ref 0.57–1.00)
Globulin, Total: 2.2 g/dL (ref 1.5–4.5)
Glucose: 92 mg/dL (ref 65–99)
Potassium: 4.3 mmol/L (ref 3.5–5.2)
Sodium: 143 mmol/L (ref 134–144)
Total Protein: 6.8 g/dL (ref 6.0–8.5)
eGFR: 103 mL/min/{1.73_m2} (ref >59)

## 2020-11-22 LAB — CBC WITH DIFFERENTIAL/PLATELET
Basophils Absolute: 0 10*3/uL (ref 0.0–0.2)
Basos: 1 %
EOS (ABSOLUTE): 0.1 10*3/uL (ref 0.0–0.4)
Eos: 1 %
Hematocrit: 38 % (ref 34.0–46.6)
Hemoglobin: 12.7 g/dL (ref 11.1–15.9)
Immature Grans (Abs): 0 10*3/uL (ref 0.0–0.1)
Immature Granulocytes: 0 %
Lymphocytes Absolute: 1.7 10*3/uL (ref 0.7–3.1)
Lymphs: 28 %
MCH: 28.3 pg (ref 26.6–33.0)
MCHC: 33.4 g/dL (ref 31.5–35.7)
MCV: 85 fL (ref 79–97)
Monocytes Absolute: 0.3 10*3/uL (ref 0.1–0.9)
Monocytes: 5 %
Neutrophils Absolute: 3.8 10*3/uL (ref 1.4–7.0)
Neutrophils: 65 %
Platelets: 264 10*3/uL (ref 150–450)
RBC: 4.48 x10E6/uL (ref 3.77–5.28)
RDW: 13 % (ref 11.7–15.4)
WBC: 5.9 10*3/uL (ref 3.4–10.8)

## 2020-11-22 LAB — LIPID PANEL W/O CHOL/HDL RATIO
Cholesterol, Total: 230 mg/dL — ABNORMAL HIGH (ref 100–199)
HDL: 62 mg/dL (ref >39)
LDL Chol Calc (NIH): 148 mg/dL — ABNORMAL HIGH (ref 0–99)
Triglycerides: 112 mg/dL (ref 0–149)
VLDL Cholesterol Cal: 20 mg/dL (ref 5–40)

## 2020-11-22 LAB — TSH: TSH: 0.691 u[IU]/mL (ref 0.450–4.500)

## 2020-11-22 LAB — HEPATITIS C ANTIBODY: Hep C Virus Ab: 0.1 s/co ratio (ref 0.0–0.9)

## 2020-11-22 LAB — HIV ANTIBODY (ROUTINE TESTING W REFLEX): HIV Screen 4th Generation wRfx: NONREACTIVE

## 2020-12-15 DIAGNOSIS — Z87891 Personal history of nicotine dependence: Secondary | ICD-10-CM | POA: Diagnosis not present

## 2020-12-15 DIAGNOSIS — Z6831 Body mass index (BMI) 31.0-31.9, adult: Secondary | ICD-10-CM | POA: Diagnosis not present

## 2020-12-15 DIAGNOSIS — M25562 Pain in left knee: Secondary | ICD-10-CM | POA: Diagnosis not present

## 2020-12-15 DIAGNOSIS — G8929 Other chronic pain: Secondary | ICD-10-CM | POA: Diagnosis not present

## 2020-12-15 DIAGNOSIS — Z0000 Encounter for general adult medical examination without abnormal findings: Secondary | ICD-10-CM | POA: Diagnosis not present

## 2020-12-16 DIAGNOSIS — M25562 Pain in left knee: Secondary | ICD-10-CM | POA: Diagnosis not present

## 2020-12-16 DIAGNOSIS — M1712 Unilateral primary osteoarthritis, left knee: Secondary | ICD-10-CM | POA: Diagnosis not present

## 2020-12-17 DIAGNOSIS — M25562 Pain in left knee: Secondary | ICD-10-CM | POA: Diagnosis not present

## 2020-12-24 DIAGNOSIS — R194 Change in bowel habit: Secondary | ICD-10-CM | POA: Diagnosis not present

## 2020-12-24 DIAGNOSIS — G8929 Other chronic pain: Secondary | ICD-10-CM | POA: Diagnosis not present

## 2020-12-24 DIAGNOSIS — Z79899 Other long term (current) drug therapy: Secondary | ICD-10-CM | POA: Diagnosis not present

## 2020-12-24 DIAGNOSIS — M25562 Pain in left knee: Secondary | ICD-10-CM | POA: Diagnosis not present

## 2021-01-01 ENCOUNTER — Other Ambulatory Visit: Payer: Self-pay

## 2021-01-01 DIAGNOSIS — Z1231 Encounter for screening mammogram for malignant neoplasm of breast: Secondary | ICD-10-CM

## 2021-01-01 NOTE — Progress Notes (Signed)
Mammogram ordered

## 2023-07-11 DIAGNOSIS — G44021 Chronic cluster headache, intractable: Secondary | ICD-10-CM | POA: Diagnosis not present

## 2023-07-25 DIAGNOSIS — I700 Atherosclerosis of aorta: Secondary | ICD-10-CM | POA: Diagnosis not present

## 2023-07-25 DIAGNOSIS — E559 Vitamin D deficiency, unspecified: Secondary | ICD-10-CM | POA: Diagnosis not present

## 2023-07-25 DIAGNOSIS — E669 Obesity, unspecified: Secondary | ICD-10-CM | POA: Diagnosis not present

## 2023-07-25 DIAGNOSIS — E538 Deficiency of other specified B group vitamins: Secondary | ICD-10-CM | POA: Diagnosis not present

## 2023-07-25 DIAGNOSIS — E785 Hyperlipidemia, unspecified: Secondary | ICD-10-CM | POA: Diagnosis not present

## 2023-07-25 DIAGNOSIS — F17200 Nicotine dependence, unspecified, uncomplicated: Secondary | ICD-10-CM | POA: Diagnosis not present

## 2023-07-25 DIAGNOSIS — Z7982 Long term (current) use of aspirin: Secondary | ICD-10-CM | POA: Diagnosis not present

## 2023-07-25 DIAGNOSIS — G8929 Other chronic pain: Secondary | ICD-10-CM | POA: Diagnosis not present

## 2023-07-25 DIAGNOSIS — M1990 Unspecified osteoarthritis, unspecified site: Secondary | ICD-10-CM | POA: Diagnosis not present

## 2023-07-25 DIAGNOSIS — G43909 Migraine, unspecified, not intractable, without status migrainosus: Secondary | ICD-10-CM | POA: Diagnosis not present

## 2023-10-11 DIAGNOSIS — G44021 Chronic cluster headache, intractable: Secondary | ICD-10-CM | POA: Diagnosis not present

## 2023-10-28 DIAGNOSIS — Z6831 Body mass index (BMI) 31.0-31.9, adult: Secondary | ICD-10-CM | POA: Diagnosis not present

## 2023-10-28 DIAGNOSIS — Z716 Tobacco abuse counseling: Secondary | ICD-10-CM | POA: Diagnosis not present

## 2023-10-28 DIAGNOSIS — E66811 Obesity, class 1: Secondary | ICD-10-CM | POA: Diagnosis not present

## 2023-10-28 DIAGNOSIS — F17290 Nicotine dependence, other tobacco product, uncomplicated: Secondary | ICD-10-CM | POA: Diagnosis not present

## 2023-10-28 DIAGNOSIS — E559 Vitamin D deficiency, unspecified: Secondary | ICD-10-CM | POA: Diagnosis not present

## 2023-10-28 DIAGNOSIS — E6609 Other obesity due to excess calories: Secondary | ICD-10-CM | POA: Diagnosis not present

## 2023-11-21 DIAGNOSIS — F17210 Nicotine dependence, cigarettes, uncomplicated: Secondary | ICD-10-CM | POA: Diagnosis not present

## 2023-11-21 DIAGNOSIS — R911 Solitary pulmonary nodule: Secondary | ICD-10-CM | POA: Diagnosis not present

## 2023-11-21 DIAGNOSIS — Z122 Encounter for screening for malignant neoplasm of respiratory organs: Secondary | ICD-10-CM | POA: Diagnosis not present

## 2023-12-05 DIAGNOSIS — F17200 Nicotine dependence, unspecified, uncomplicated: Secondary | ICD-10-CM | POA: Diagnosis not present

## 2023-12-05 DIAGNOSIS — G44021 Chronic cluster headache, intractable: Secondary | ICD-10-CM | POA: Diagnosis not present

## 2023-12-05 DIAGNOSIS — E6609 Other obesity due to excess calories: Secondary | ICD-10-CM | POA: Diagnosis not present

## 2023-12-05 DIAGNOSIS — Z6832 Body mass index (BMI) 32.0-32.9, adult: Secondary | ICD-10-CM | POA: Diagnosis not present

## 2023-12-05 DIAGNOSIS — E66811 Obesity, class 1: Secondary | ICD-10-CM | POA: Diagnosis not present

## 2023-12-05 DIAGNOSIS — Z6831 Body mass index (BMI) 31.0-31.9, adult: Secondary | ICD-10-CM | POA: Diagnosis not present

## 2023-12-05 DIAGNOSIS — Z8639 Personal history of other endocrine, nutritional and metabolic disease: Secondary | ICD-10-CM | POA: Diagnosis not present

## 2024-01-13 DIAGNOSIS — G44021 Chronic cluster headache, intractable: Secondary | ICD-10-CM | POA: Diagnosis not present

## 2024-02-28 DIAGNOSIS — Z8639 Personal history of other endocrine, nutritional and metabolic disease: Secondary | ICD-10-CM | POA: Diagnosis not present

## 2024-02-28 DIAGNOSIS — Z6831 Body mass index (BMI) 31.0-31.9, adult: Secondary | ICD-10-CM | POA: Diagnosis not present

## 2024-02-28 DIAGNOSIS — E66811 Obesity, class 1: Secondary | ICD-10-CM | POA: Diagnosis not present

## 2024-02-28 DIAGNOSIS — Z6832 Body mass index (BMI) 32.0-32.9, adult: Secondary | ICD-10-CM | POA: Diagnosis not present

## 2024-02-28 DIAGNOSIS — E6609 Other obesity due to excess calories: Secondary | ICD-10-CM | POA: Diagnosis not present
